# Patient Record
Sex: Female | Born: 1950 | Race: Black or African American | Hispanic: No | Smoking: Former smoker
Health system: Southern US, Community
[De-identification: ages and names within clinical notes are randomized; demographics above are authoritative.]

## PROBLEM LIST (undated history)

## (undated) DIAGNOSIS — I10 Essential (primary) hypertension: Secondary | ICD-10-CM

## (undated) DIAGNOSIS — E7800 Pure hypercholesterolemia, unspecified: Secondary | ICD-10-CM

## (undated) DIAGNOSIS — H579 Unspecified disorder of eye and adnexa: Secondary | ICD-10-CM

## (undated) DIAGNOSIS — F329 Major depressive disorder, single episode, unspecified: Secondary | ICD-10-CM

## (undated) DIAGNOSIS — F419 Anxiety disorder, unspecified: Secondary | ICD-10-CM

## (undated) DIAGNOSIS — F32A Depression, unspecified: Secondary | ICD-10-CM

## (undated) HISTORY — DX: Major depressive disorder, single episode, unspecified: F32.9

## (undated) HISTORY — DX: Unspecified disorder of eye and adnexa: H57.9

## (undated) HISTORY — DX: Pure hypercholesterolemia, unspecified: E78.00

## (undated) HISTORY — DX: Depression, unspecified: F32.A

## (undated) HISTORY — PX: ABDOMINAL HYSTERECTOMY: SHX81

## (undated) HISTORY — DX: Anxiety disorder, unspecified: F41.9

## (undated) HISTORY — PX: APPENDECTOMY: SHX54

---

## 1998-04-17 ENCOUNTER — Emergency Department (HOSPITAL_COMMUNITY): Admission: EM | Admit: 1998-04-17 | Discharge: 1998-04-17 | Payer: Self-pay | Admitting: Emergency Medicine

## 1999-04-20 ENCOUNTER — Other Ambulatory Visit: Admission: RE | Admit: 1999-04-20 | Discharge: 1999-04-20 | Payer: Self-pay | Admitting: Gynecology

## 1999-05-21 ENCOUNTER — Emergency Department (HOSPITAL_COMMUNITY): Admission: EM | Admit: 1999-05-21 | Discharge: 1999-05-21 | Payer: Self-pay | Admitting: Internal Medicine

## 1999-05-28 ENCOUNTER — Encounter: Payer: Self-pay | Admitting: Gynecology

## 1999-05-31 ENCOUNTER — Inpatient Hospital Stay (HOSPITAL_COMMUNITY): Admission: RE | Admit: 1999-05-31 | Discharge: 1999-06-02 | Payer: Self-pay | Admitting: Gynecology

## 1999-05-31 ENCOUNTER — Encounter (INDEPENDENT_AMBULATORY_CARE_PROVIDER_SITE_OTHER): Payer: Self-pay | Admitting: Specialist

## 2000-01-13 ENCOUNTER — Encounter: Payer: Self-pay | Admitting: Emergency Medicine

## 2000-01-13 ENCOUNTER — Emergency Department (HOSPITAL_COMMUNITY): Admission: EM | Admit: 2000-01-13 | Discharge: 2000-01-13 | Payer: Self-pay | Admitting: Emergency Medicine

## 2000-04-07 ENCOUNTER — Ambulatory Visit (HOSPITAL_COMMUNITY): Admission: RE | Admit: 2000-04-07 | Discharge: 2000-04-07 | Payer: Self-pay | Admitting: Gynecology

## 2000-04-07 ENCOUNTER — Encounter: Payer: Self-pay | Admitting: Gynecology

## 2000-04-21 ENCOUNTER — Other Ambulatory Visit: Admission: RE | Admit: 2000-04-21 | Discharge: 2000-04-21 | Payer: Self-pay | Admitting: Gynecology

## 2001-06-03 ENCOUNTER — Other Ambulatory Visit: Admission: RE | Admit: 2001-06-03 | Discharge: 2001-06-03 | Payer: Self-pay | Admitting: Gynecology

## 2001-08-13 ENCOUNTER — Encounter: Payer: Self-pay | Admitting: Gynecology

## 2001-08-13 ENCOUNTER — Ambulatory Visit (HOSPITAL_COMMUNITY): Admission: RE | Admit: 2001-08-13 | Discharge: 2001-08-13 | Payer: Self-pay | Admitting: Gynecology

## 2002-06-29 ENCOUNTER — Other Ambulatory Visit: Admission: RE | Admit: 2002-06-29 | Discharge: 2002-06-29 | Payer: Self-pay | Admitting: Gynecology

## 2002-11-22 ENCOUNTER — Encounter: Payer: Self-pay | Admitting: Gynecology

## 2002-11-22 ENCOUNTER — Ambulatory Visit (HOSPITAL_COMMUNITY): Admission: RE | Admit: 2002-11-22 | Discharge: 2002-11-22 | Payer: Self-pay | Admitting: Gynecology

## 2003-08-29 ENCOUNTER — Emergency Department (HOSPITAL_COMMUNITY): Admission: EM | Admit: 2003-08-29 | Discharge: 2003-08-30 | Payer: Self-pay | Admitting: Emergency Medicine

## 2004-01-19 ENCOUNTER — Ambulatory Visit (HOSPITAL_COMMUNITY): Admission: RE | Admit: 2004-01-19 | Discharge: 2004-01-19 | Payer: Self-pay | Admitting: Gynecology

## 2005-12-31 ENCOUNTER — Ambulatory Visit (HOSPITAL_COMMUNITY): Admission: RE | Admit: 2005-12-31 | Discharge: 2005-12-31 | Payer: Self-pay | Admitting: Gynecology

## 2006-01-01 ENCOUNTER — Other Ambulatory Visit: Admission: RE | Admit: 2006-01-01 | Discharge: 2006-01-01 | Payer: Self-pay | Admitting: Gynecology

## 2006-06-03 ENCOUNTER — Emergency Department (HOSPITAL_COMMUNITY): Admission: EM | Admit: 2006-06-03 | Discharge: 2006-06-03 | Payer: Self-pay | Admitting: Family Medicine

## 2007-01-09 ENCOUNTER — Ambulatory Visit (HOSPITAL_COMMUNITY): Admission: RE | Admit: 2007-01-09 | Discharge: 2007-01-09 | Payer: Self-pay | Admitting: Gynecology

## 2008-03-30 ENCOUNTER — Emergency Department (HOSPITAL_COMMUNITY): Admission: EM | Admit: 2008-03-30 | Discharge: 2008-03-30 | Payer: Self-pay | Admitting: Emergency Medicine

## 2008-07-15 ENCOUNTER — Emergency Department (HOSPITAL_COMMUNITY): Admission: EM | Admit: 2008-07-15 | Discharge: 2008-07-15 | Payer: Self-pay | Admitting: Emergency Medicine

## 2008-08-11 ENCOUNTER — Ambulatory Visit (HOSPITAL_COMMUNITY): Admission: RE | Admit: 2008-08-11 | Discharge: 2008-08-11 | Payer: Self-pay | Admitting: Gynecology

## 2008-08-18 ENCOUNTER — Emergency Department (HOSPITAL_COMMUNITY): Admission: EM | Admit: 2008-08-18 | Discharge: 2008-08-19 | Payer: Self-pay | Admitting: Emergency Medicine

## 2008-08-23 ENCOUNTER — Encounter (INDEPENDENT_AMBULATORY_CARE_PROVIDER_SITE_OTHER): Payer: Self-pay | Admitting: *Deleted

## 2008-08-23 DIAGNOSIS — F418 Other specified anxiety disorders: Secondary | ICD-10-CM

## 2008-08-23 DIAGNOSIS — K219 Gastro-esophageal reflux disease without esophagitis: Secondary | ICD-10-CM | POA: Insufficient documentation

## 2008-08-26 ENCOUNTER — Encounter: Payer: Self-pay | Admitting: Family Medicine

## 2008-08-26 ENCOUNTER — Ambulatory Visit: Payer: Self-pay | Admitting: Family Medicine

## 2008-08-26 DIAGNOSIS — R5381 Other malaise: Secondary | ICD-10-CM

## 2008-08-26 DIAGNOSIS — E663 Overweight: Secondary | ICD-10-CM | POA: Insufficient documentation

## 2008-08-26 DIAGNOSIS — R209 Unspecified disturbances of skin sensation: Secondary | ICD-10-CM | POA: Insufficient documentation

## 2008-08-26 DIAGNOSIS — R5383 Other fatigue: Secondary | ICD-10-CM

## 2008-08-26 DIAGNOSIS — I10 Essential (primary) hypertension: Secondary | ICD-10-CM

## 2008-08-26 DIAGNOSIS — R011 Cardiac murmur, unspecified: Secondary | ICD-10-CM

## 2008-08-29 LAB — CONVERTED CEMR LAB
ALT: 10 units/L (ref 0–35)
AST: 19 units/L (ref 0–37)
Albumin: 4.4 g/dL (ref 3.5–5.2)
Alkaline Phosphatase: 37 units/L — ABNORMAL LOW (ref 39–117)
HDL: 71 mg/dL (ref >39)
MCHC: 31.6 g/dL (ref 30.0–36.0)
MCV: 86.8 fL (ref 78.0–100.0)
Platelets: 336 10*3/uL (ref 150–400)
Potassium: 4.7 meq/L (ref 3.5–5.3)
RDW: 13.8 % (ref 11.5–15.5)
Sodium: 142 meq/L (ref 135–145)
TSH: 1.371 microintl units/mL (ref 0.350–4.500)
Total Bilirubin: 0.8 mg/dL (ref 0.3–1.2)
Total CHOL/HDL Ratio: 3.8
Total Protein: 7.7 g/dL (ref 6.0–8.3)

## 2008-09-06 ENCOUNTER — Encounter: Payer: Self-pay | Admitting: Family Medicine

## 2008-09-16 DIAGNOSIS — K6389 Other specified diseases of intestine: Secondary | ICD-10-CM

## 2008-09-16 DIAGNOSIS — K5730 Diverticulosis of large intestine without perforation or abscess without bleeding: Secondary | ICD-10-CM | POA: Insufficient documentation

## 2008-10-03 ENCOUNTER — Encounter: Payer: Self-pay | Admitting: Family Medicine

## 2010-02-19 ENCOUNTER — Ambulatory Visit (HOSPITAL_COMMUNITY)
Admission: RE | Admit: 2010-02-19 | Discharge: 2010-02-19 | Payer: Self-pay | Source: Home / Self Care | Attending: Family Medicine | Admitting: Family Medicine

## 2010-06-18 LAB — URINALYSIS, ROUTINE W REFLEX MICROSCOPIC
Ketones, ur: NEGATIVE mg/dL
Protein, ur: NEGATIVE mg/dL
Specific Gravity, Urine: 1.008 (ref 1.005–1.030)
pH: 5.5 (ref 5.0–8.0)

## 2010-06-18 LAB — COMPREHENSIVE METABOLIC PANEL
Alkaline Phosphatase: 43 U/L (ref 39–117)
BUN: 5 mg/dL — ABNORMAL LOW (ref 6–23)
GFR calc non Af Amer: >60 mL/min (ref >60)
Glucose, Bld: 99 mg/dL (ref 70–99)
Total Protein: 7.2 g/dL (ref 6.0–8.3)

## 2010-06-18 LAB — DIFFERENTIAL
Lymphocytes Relative: 41 % (ref 12–46)
Monocytes Absolute: 0.5 10*3/uL (ref 0.1–1.0)
Neutro Abs: 4.2 10*3/uL (ref 1.7–7.7)

## 2010-06-18 LAB — URINE MICROSCOPIC-ADD ON

## 2010-06-18 LAB — CBC
HCT: 37.8 % (ref 36.0–46.0)
Platelets: 344 10*3/uL (ref 150–400)
RBC: 4.35 MIL/uL (ref 3.87–5.11)
WBC: 8.1 10*3/uL (ref 4.0–10.5)

## 2011-01-08 ENCOUNTER — Emergency Department (HOSPITAL_COMMUNITY)
Admission: EM | Admit: 2011-01-08 | Discharge: 2011-01-09 | Disposition: A | Discharge status: Home / Self Care | Payer: Self-pay | Attending: Emergency Medicine | Admitting: Emergency Medicine

## 2011-01-08 DIAGNOSIS — R300 Dysuria: Secondary | ICD-10-CM | POA: Insufficient documentation

## 2011-01-08 DIAGNOSIS — N3090 Cystitis, unspecified without hematuria: Secondary | ICD-10-CM | POA: Insufficient documentation

## 2011-01-08 DIAGNOSIS — R310 Gross hematuria: Secondary | ICD-10-CM | POA: Insufficient documentation

## 2011-01-08 DIAGNOSIS — R109 Unspecified abdominal pain: Secondary | ICD-10-CM | POA: Insufficient documentation

## 2011-01-09 ENCOUNTER — Encounter (HOSPITAL_COMMUNITY): Payer: Self-pay

## 2011-01-09 ENCOUNTER — Emergency Department (HOSPITAL_COMMUNITY): Payer: Self-pay

## 2011-01-09 LAB — URINALYSIS, ROUTINE W REFLEX MICROSCOPIC
Bilirubin Urine: NEGATIVE
Nitrite: NEGATIVE
Protein, ur: 100 mg/dL — AB
Urobilinogen, UA: 1 mg/dL (ref 0.0–1.0)

## 2011-01-09 LAB — URINE MICROSCOPIC-ADD ON

## 2011-01-09 LAB — POCT I-STAT, CHEM 8
BUN: 21 mg/dL (ref 6–23)
Creatinine, Ser: 1 mg/dL (ref 0.50–1.10)
TCO2: 24 mmol/L (ref 0–100)

## 2011-01-09 LAB — DIFFERENTIAL
Basophils Relative: 0 % (ref 0–1)
Lymphocytes Relative: 25 % (ref 12–46)
Monocytes Absolute: 0.7 10*3/uL (ref 0.1–1.0)
Monocytes Relative: 6 % (ref 3–12)
Neutro Abs: 7.5 10*3/uL (ref 1.7–7.7)

## 2011-01-09 LAB — CBC
HCT: 40.7 % (ref 36.0–46.0)
Hemoglobin: 13.9 g/dL (ref 12.0–15.0)
MCH: 29.3 pg (ref 26.0–34.0)
MCV: 85.9 fL (ref 78.0–100.0)
Platelets: 360 10*3/uL (ref 150–400)
RDW: 13.2 % (ref 11.5–15.5)

## 2011-01-09 MED ORDER — IOHEXOL 300 MG/ML  SOLN
100.0000 mL | Freq: Once | INTRAMUSCULAR | Status: AC | PRN
  Administered 2011-01-09: 100 mL via INTRAVENOUS

## 2011-05-28 ENCOUNTER — Encounter (HOSPITAL_COMMUNITY): Payer: Self-pay | Admitting: Emergency Medicine

## 2011-05-28 ENCOUNTER — Emergency Department (INDEPENDENT_AMBULATORY_CARE_PROVIDER_SITE_OTHER)
Admission: EM | Admit: 2011-05-28 | Discharge: 2011-05-28 | Disposition: A | Discharge status: Home / Self Care | Payer: Self-pay | Source: Home / Self Care | Attending: Emergency Medicine | Admitting: Emergency Medicine

## 2011-05-28 DIAGNOSIS — N3000 Acute cystitis without hematuria: Secondary | ICD-10-CM

## 2011-05-28 HISTORY — DX: Essential (primary) hypertension: I10

## 2011-05-28 LAB — POCT URINALYSIS DIP (DEVICE)
Bilirubin Urine: NEGATIVE
Leukocytes, UA: NEGATIVE
Protein, ur: 30 mg/dL — AB

## 2011-05-28 LAB — WET PREP, GENITAL: Trich, Wet Prep: NONE SEEN

## 2011-05-28 MED ORDER — HYDROCHLOROTHIAZIDE 12.5 MG PO TABS: 12.5000 mg | ORAL_TABLET | Freq: Every day | ORAL | Status: DC

## 2011-05-28 MED ORDER — CEPHALEXIN 500 MG PO CAPS: 500.0000 mg | ORAL_CAPSULE | Freq: Three times a day (TID) | ORAL | Status: AC

## 2011-05-28 MED ORDER — PHENAZOPYRIDINE HCL 200 MG PO TABS: 200.0000 mg | ORAL_TABLET | Freq: Three times a day (TID) | ORAL | Status: AC | PRN

## 2011-05-28 NOTE — ED Notes (Addendum)
PT HERE WITH SX BLADDER PRESSURE AND PAIN WITH URINATION,BURNING AND DYSURIA THAT FLARED UP Sunday.PT WAS SEEN IN ER FOR SAME SX X AGO AND TREATED WITH ORAL ATB'S.PT IN PROCESS OF GETTING ORANGE CARD  BUT STATES THE PAIN IS GETTING WORSE.DENIES VAG D/C OR HEMATURIA.TEMP 99.1

## 2011-05-28 NOTE — ED Provider Notes (Signed)
Chief Complaint  Patient presents with  . Urinary Tract Infection    History of Present Illness:   Tammy Santiago is a 61 year old female who has had suprapubic pain, pain with urination, burning with urination, difficulty urinating, and urinating frequent, small amounts. She's not had any blood in her urine. She felt chilled and had any fever or sweats. No nausea, vomiting, or anorexia. No constipation or diarrhea. No GYN complaints. She had a urinary tract infection before, about 6 months ago. She was at was [facility]. Because of severe pain she underwent a pelvic CT scan which only showed thickened bladder walls. She was given antibiotics and her symptoms improved. She is status post hysterectomy.  Review of Systems:  Other than noted above, the patient denies any of the following symptoms: Constitutional:  No fever, chills, fatigue, weight loss or anorexia. Lungs:  No cough or shortness of breath. Heart:  No chest pain, palpitations, syncope or edema. Abdomen:  No nausea, vomiting, hematememesis, melena, diarrhea, or hematochezia. GU:  No dysuria, frequency, urgency, or hematuria. Gyn:  No vaginal discharge, itching, abnormal bleeding or pelvic pain. Skin:  No rash or itching.  PMFSH:  Past medical history, family history, social history, meds, and allergies were reviewed.  Physical Exam:   Vital signs:  BP 183/94  Pulse 65  Temp(Src) 99.1 F (37.3 C) (Oral)  Resp 17  SpO2 97% Gen:  Alert, oriented, in no distress. Lungs:  Breath sounds clear and equal bilaterally.  No wheezes, rales or rhonchi. Heart:  Regular rhythm.  No gallops or murmers.   Abdomen:  Abdomen is flat, soft, nondistended. She has moderate suprapubic and bilateral lower quadrant pain to palpation without guarding or rebound. No organomegaly or mass. Bowel sounds are normally active. Pelvic:  Normal external genitalia. Vaginal mucosa was normal. Cervix and uterus were surgically absent. She has moderate pain  on pelvic exam at the midline and in both lower quadrants. No masses. Skin:  Clear, warm and dry.  No rash.  Labs:   Results for orders placed during the hospital encounter of 05/28/11  POCT URINALYSIS DIP (DEVICE)      Component Value Range   Glucose, UA NEGATIVE  NEGATIVE (mg/dL)   Bilirubin Urine NEGATIVE  NEGATIVE    Ketones, ur NEGATIVE  NEGATIVE (mg/dL)   Specific Gravity, Urine 1.010  1.005 - 1.030    Hgb urine dipstick TRACE (*) NEGATIVE    pH >=9.0  5.0 - 8.0    Protein, ur 30 (*) NEGATIVE (mg/dL)   Urobilinogen, UA 0.2  0.0 - 1.0 (mg/dL)   Nitrite NEGATIVE  NEGATIVE    Leukocytes, UA NEGATIVE  NEGATIVE   WET PREP, GENITAL      Component Value Range   Yeast Wet Prep HPF POC NONE SEEN  NONE SEEN    Trich, Wet Prep NONE SEEN  NONE SEEN    Clue Cells Wet Prep HPF POC NONE SEEN  NONE SEEN    WBC, Wet Prep HPF POC TOO NUMEROUS TO COUNT (*) NONE SEEN     Other Labs Obtained at Urgent Care Center:  A urine culture was obtained.  Results are pending at this time and we will call about any positive results.  Assessment:   Diagnoses that have been ruled out:  None  Diagnoses that are still under consideration:  None  Final diagnoses:  Acute cystitis    Plan:   1.  The following meds were prescribed:   New Prescriptions  CEPHALEXIN (KEFLEX) 500 MG CAPSULE    Take 1 capsule (500 mg total) by mouth 3 (three) times daily.   HYDROCHLOROTHIAZIDE (HYDRODIURIL) 12.5 MG TABLET    Take 1 tablet (12.5 mg total) by mouth daily.   PHENAZOPYRIDINE (PYRIDIUM) 200 MG TABLET    Take 1 tablet (200 mg total) by mouth 3 (three) times daily as needed for pain.   2.  The patient was instructed in symptomatic care and handouts were given. 3.  The patient was told to return if becoming worse in any way, if no better in 3 or 4 days, and given some red flag symptoms that would indicate earlier return. 4.  The patient was told to followup with her primary care doctor because of elevated blood  pressure.    Reuben Likes, MD 05/28/11 [phone]

## 2011-05-28 NOTE — Discharge Instructions (Signed)

## 2011-05-29 LAB — URINE CULTURE
Colony Count: 50000
Culture  Setup Time: 201303191518

## 2011-06-18 ENCOUNTER — Encounter: Payer: Self-pay | Admitting: Family Medicine

## 2011-06-18 ENCOUNTER — Ambulatory Visit (INDEPENDENT_AMBULATORY_CARE_PROVIDER_SITE_OTHER): Payer: Self-pay | Admitting: Family Medicine

## 2011-06-18 ENCOUNTER — Telehealth: Payer: Self-pay | Admitting: Family Medicine

## 2011-06-18 VITALS — BP 157/79 | HR 66 | Temp 98.5°F | Ht 59.5 in | Wt 144.0 lb

## 2011-06-18 DIAGNOSIS — I10 Essential (primary) hypertension: Secondary | ICD-10-CM

## 2011-06-18 DIAGNOSIS — F329 Major depressive disorder, single episode, unspecified: Secondary | ICD-10-CM

## 2011-06-18 DIAGNOSIS — E559 Vitamin D deficiency, unspecified: Secondary | ICD-10-CM | POA: Insufficient documentation

## 2011-06-18 DIAGNOSIS — K219 Gastro-esophageal reflux disease without esophagitis: Secondary | ICD-10-CM

## 2011-06-18 DIAGNOSIS — R5383 Other fatigue: Secondary | ICD-10-CM

## 2011-06-18 DIAGNOSIS — E663 Overweight: Secondary | ICD-10-CM

## 2011-06-18 DIAGNOSIS — R5381 Other malaise: Secondary | ICD-10-CM

## 2011-06-18 DIAGNOSIS — F3289 Other specified depressive episodes: Secondary | ICD-10-CM

## 2011-06-18 HISTORY — DX: Gastro-esophageal reflux disease without esophagitis: K21.9

## 2011-06-18 LAB — LIPID PANEL
Cholesterol: 218 mg/dL — ABNORMAL HIGH (ref 0–200)
HDL: 74 mg/dL (ref >39)
Triglycerides: 74 mg/dL (ref <150)
VLDL: 15 mg/dL (ref 0–40)

## 2011-06-18 LAB — COMPREHENSIVE METABOLIC PANEL
AST: 21 U/L (ref 0–37)
BUN: 19 mg/dL (ref 6–23)
Calcium: 9.4 mg/dL (ref 8.4–10.5)
Chloride: 108 mEq/L (ref 96–112)
Creat: 0.67 mg/dL (ref 0.50–1.10)

## 2011-06-18 LAB — TSH: TSH: 1.484 u[IU]/mL (ref 0.350–4.500)

## 2011-06-18 MED ORDER — FAMOTIDINE 20 MG PO TABS: 20.0000 mg | ORAL_TABLET | Freq: Two times a day (BID) | ORAL | Status: DC

## 2011-06-18 MED ORDER — CITALOPRAM HYDROBROMIDE 20 MG PO TABS: 20.0000 mg | ORAL_TABLET | Freq: Every day | ORAL | Status: DC

## 2011-06-18 MED ORDER — HYDROCHLOROTHIAZIDE 12.5 MG PO TABS: 12.5000 mg | ORAL_TABLET | Freq: Every day | ORAL | Status: DC

## 2011-06-18 MED ORDER — HYDROXYZINE HCL 10 MG PO TABS
10.0000 mg | ORAL_TABLET | Freq: Three times a day (TID) | ORAL | Status: DC | PRN
Start: 2011-06-18 — End: 2011-06-19

## 2011-06-18 NOTE — Telephone Encounter (Signed)
Pt states that the anxiety med is too expensive - wants to know if there is something cheaper.  [facility] [place]

## 2011-06-18 NOTE — Patient Instructions (Signed)
Very nice to meet you. I am giving you some new medications. I'm giving you your blood pressure medicine called hydrochlorothiazide. Take one pill daily I'm giving you a medicine for her depression. I when she to take one pill daily. Please be patient this medication will take 3-4 weeks to really start working. I am also giving you a medicine for your anxiety. You can take this medicine up to 3 times a day as needed. I refilled your reflex medicine to try to help with that heartburn. I'm going to get some labs today and I will call you when I get the results. Please come back at her followup appointment so I can see how these medications are helping.

## 2011-06-18 NOTE — Telephone Encounter (Signed)
Patient called back and said that her son is going to send her the money to get the medication.

## 2011-06-19 ENCOUNTER — Telehealth: Payer: Self-pay | Admitting: Family Medicine

## 2011-06-19 MED ORDER — HYDROXYZINE HCL 10 MG PO TABS: 10.0000 mg | ORAL_TABLET | Freq: Three times a day (TID) | ORAL | Status: DC | PRN

## 2011-06-19 NOTE — Assessment & Plan Note (Signed)
Refilled previcid, no formal consult by GI in the past, no red flags will continue to monitor. Trial of PPI

## 2011-06-19 NOTE — Telephone Encounter (Signed)
Called pt and discussed the hydroxizine will send to [facility] due to it being on the four dollar plan. Told her labs are doing fine we'll continue current regimen.

## 2011-06-19 NOTE — Assessment & Plan Note (Addendum)
PHQ 9 score of 22 on April 9 Started on Celexa on April 9. Declined therapy. Discussed safety plan and if side effects of medication occurs.  F/u in 2 weeks.

## 2011-06-19 NOTE — Assessment & Plan Note (Addendum)
Pt is not at goal and not taking medication. Restart hydrochlorothiazide recheck blood pressure in 2 weeks. Baseline be met at this time as well.

## 2011-06-19 NOTE — Telephone Encounter (Signed)
Called patient back told that the medication is on the four dollar plan of care.

## 2011-06-19 NOTE — Progress Notes (Signed)
  Subjective:    Patient ID: Tammy Santiago, female    DOB: 1950/06/25, 61 y.o.   MRN: [id number]  HPI 61 year old female here to establish care as a new patient.  1. Hypertension Blood pressure at home: Not checking Blood pressure today: 157/79 Taking Meds: No supposed to be on hydrochlorothiazide continue to get it from urgent care or emergency room. Side effects: No ROS: Denies headache visual changes nausea, vomiting, chest pain or abdominal pain or shortness of breath.  #2 GERD- patient has a history of GERD she states clots heartburn after certain meals. Patient had take Pepcid in the past with good results. Patient denies any weight loss any nausea vomiting any change in bowel habits. Patient has not been taking the medication she is currently out.  #3 anxiety-patient states most of the time she feels so anxious that she does not even want to leave the house. Patient has had this feeling for multiple years. Patient has not ever seen a professional for this problem. Patient states that this problem has gotten much worse recently and she has times where she feels like she has to cry all the time. Patient also states she gets poor sleep has lots of insomnia does not do activities that she enjoys but denies suicidal or homicidal ideation. Patient denies any hallucinations visual or auditory but states from time to time certain things do not seem real. PHQ 9 score of 22  Past Medical History  Diagnosis Date  . Hypertension   . Anxiety   . Depression    History reviewed. No pertinent past surgical history.  History  Substance Use Topics  . Smoking status: Never Smoker   . Smokeless tobacco: Not on file  . Alcohol Use: No    Family History  Problem Relation Age of Onset  . Alcohol abuse Mother   . Heart disease Mother   . Depression Mother   . Diabetes Mother   . Hyperlipidemia Mother   . Hypertension Mother     Review of Systems Denies fever, chills, nausea vomiting  abdominal pain, dysuria, chest pain, shortness of breath dyspnea on exertion or numbness in extremities     Objective:   Physical Exam  Vitals reviewed. Constitutional: She is oriented to person, place, and time. She appears well-developed.  HENT:  Right Ear: External ear normal.  Left Ear: External ear normal.  Mouth/Throat: Oropharynx is clear and moist.  Eyes: Conjunctivae are normal. Pupils are equal, round, and reactive to light.  Neck: Normal range of motion. No thyromegaly present.  Cardiovascular: Normal rate and regular rhythm.   Murmur heard.      1/6 SEM LLSB  Abdominal: Soft. Bowel sounds are normal.  Musculoskeletal: Normal range of motion.  Neurological: She is alert and oriented to person, place, and time. She has normal reflexes.  Skin: Skin is warm and dry.  Psychiatric:       Patient does seem anxious with pressured speech able to stand topic able to follow directions well. Does seem to have good judgment.      Assessment & Plan:

## 2011-07-08 ENCOUNTER — Encounter: Payer: Self-pay | Admitting: Family Medicine

## 2011-07-08 ENCOUNTER — Ambulatory Visit (INDEPENDENT_AMBULATORY_CARE_PROVIDER_SITE_OTHER): Payer: Self-pay | Admitting: Family Medicine

## 2011-07-08 VITALS — BP 124/77 | HR 61 | Temp 98.3°F | Ht 59.5 in | Wt 144.2 lb

## 2011-07-08 DIAGNOSIS — I10 Essential (primary) hypertension: Secondary | ICD-10-CM

## 2011-07-08 DIAGNOSIS — F3289 Other specified depressive episodes: Secondary | ICD-10-CM

## 2011-07-08 DIAGNOSIS — K219 Gastro-esophageal reflux disease without esophagitis: Secondary | ICD-10-CM

## 2011-07-08 DIAGNOSIS — F329 Major depressive disorder, single episode, unspecified: Secondary | ICD-10-CM

## 2011-07-08 MED ORDER — HYDROXYZINE HCL 10 MG PO TABS: 10.0000 mg | ORAL_TABLET | Freq: Three times a day (TID) | ORAL | Status: AC | PRN

## 2011-07-08 MED ORDER — CITALOPRAM HYDROBROMIDE 20 MG PO TABS: 20.0000 mg | ORAL_TABLET | Freq: Every day | ORAL | Status: DC

## 2011-07-08 NOTE — Progress Notes (Signed)
  Subjective:    Patient ID: Tammy Santiago, female    DOB: [birth year], 61 y.o.   MRN: [id number]  HPI  61 year old female here to establish care as a new patient.  1.  F/u Hypertension Blood pressure at home: Not checking Blood pressure today: 124/77 Taking Meds:yes Side effects: No ROS: Denies headache visual changes nausea, vomiting, chest pain or abdominal pain or shortness of breath.  #2  F/u GERD- patient is to get amantadine and is feeling much better. Patient denies any heartburn any diarrhea constipation any weight loss out of the ordinary.  #57F/u  anxiety-patient was placed on Celexa and hydroxyzine for breakthrough anxiety approximately 3 weeks ago. PHQ 9 score of 22 on April 10 now score is 5  Past Medical History  Diagnosis Date  . Hypertension   . Anxiety   . Depression    No past surgical history on file.  History  Substance Use Topics  . Smoking status: Never Smoker   . Smokeless tobacco: Not on file  . Alcohol Use: No    Family History  Problem Relation Age of Onset  . Alcohol abuse Mother   . Heart disease Mother   . Depression Mother   . Diabetes Mother   . Hyperlipidemia Mother   . Hypertension Mother     Review of Systems  Denies fever, chills, nausea vomiting abdominal pain, dysuria, chest pain, shortness of breath dyspnea on exertion or numbness in extremities     Objective:   Physical Exam  Vitals reviewed. Constitutional: She is oriented to person, place, and time. She appears well-developed.  HENT:  Right Ear: External ear normal.  Left Ear: External ear normal.  Mouth/Throat: Oropharynx is clear and moist.  Eyes: Conjunctivae are normal. Pupils are equal, round, and reactive to light.  Neck: Normal range of motion. No thyromegaly present.  Cardiovascular: Normal rate and regular rhythm.   Murmur heard.      1/6 SEM LLSB  Abdominal: Soft. Bowel sounds are normal.  Musculoskeletal: Normal range of motion.  Neurological: She is  alert and oriented to person, place, and time. She has normal reflexes.  Skin: Skin is warm and dry.  Psychiatric:       Patient is in good spirits much friendlier not anxious at all.      Assessment & Plan:

## 2011-07-08 NOTE — Patient Instructions (Signed)
It is fantastic to see you. Your blood pressure is perfect! We will continue the same medications. I am giving you a prescription for the hydroxyzine to have on hand when you run out. I would see if you can find another coupon when you need it. One she'll have Jaynee Eagles please give [place] a call and we will try to get her colonoscopy and mammogram scheduled. You can call us at [phone] Once again I will be leaving at the end of June but if you need anything before then please don't hesitate to call. I think you new physician will be Dr. Annice Pih.

## 2011-07-08 NOTE — Assessment & Plan Note (Signed)
Well-controlled we'll make no changes.

## 2011-07-08 NOTE — Assessment & Plan Note (Signed)
At goal we'll make no changes in management.

## 2011-07-08 NOTE — Assessment & Plan Note (Signed)
Continue 20 mg of Celexa.

## 2011-07-17 ENCOUNTER — Other Ambulatory Visit: Payer: Self-pay | Admitting: Family Medicine

## 2011-07-17 DIAGNOSIS — Z1231 Encounter for screening mammogram for malignant neoplasm of breast: Secondary | ICD-10-CM

## 2011-08-12 ENCOUNTER — Ambulatory Visit (HOSPITAL_COMMUNITY)
Admission: RE | Admit: 2011-08-12 | Discharge: 2011-08-12 | Disposition: A | Discharge status: Home / Self Care | Payer: Self-pay | Source: Ambulatory Visit | Attending: *Deleted | Admitting: *Deleted

## 2011-08-12 DIAGNOSIS — Z1231 Encounter for screening mammogram for malignant neoplasm of breast: Secondary | ICD-10-CM | POA: Insufficient documentation

## 2011-08-19 ENCOUNTER — Other Ambulatory Visit: Payer: Self-pay | Admitting: Family Medicine

## 2011-08-19 DIAGNOSIS — R928 Other abnormal and inconclusive findings on diagnostic imaging of breast: Secondary | ICD-10-CM

## 2011-08-27 ENCOUNTER — Ambulatory Visit
Admission: RE | Admit: 2011-08-27 | Discharge: 2011-08-27 | Disposition: A | Discharge status: Home / Self Care | Payer: Self-pay | Source: Ambulatory Visit | Attending: Family Medicine | Admitting: Family Medicine

## 2011-08-27 DIAGNOSIS — R928 Other abnormal and inconclusive findings on diagnostic imaging of breast: Secondary | ICD-10-CM

## 2011-09-26 ENCOUNTER — Emergency Department (INDEPENDENT_AMBULATORY_CARE_PROVIDER_SITE_OTHER): Payer: Self-pay

## 2011-09-26 ENCOUNTER — Emergency Department (INDEPENDENT_AMBULATORY_CARE_PROVIDER_SITE_OTHER)
Admission: EM | Admit: 2011-09-26 | Discharge: 2011-09-26 | Disposition: A | Discharge status: Home / Self Care | Payer: Self-pay | Source: Home / Self Care | Attending: Family Medicine | Admitting: Family Medicine

## 2011-09-26 ENCOUNTER — Encounter (HOSPITAL_COMMUNITY): Payer: Self-pay | Admitting: Nurse Practitioner

## 2011-09-26 DIAGNOSIS — M25559 Pain in unspecified hip: Secondary | ICD-10-CM

## 2011-09-26 DIAGNOSIS — M25552 Pain in left hip: Secondary | ICD-10-CM

## 2011-09-26 MED ORDER — CYCLOBENZAPRINE HCL 10 MG PO TABS: 10.0000 mg | ORAL_TABLET | Freq: Two times a day (BID) | ORAL | Status: DC | PRN

## 2011-09-26 MED ORDER — HYDROCODONE-ACETAMINOPHEN 5-325 MG PO TABS
ORAL_TABLET | ORAL | Status: AC
  Filled 2011-09-26: qty 1

## 2011-09-26 MED ORDER — TRAMADOL HCL 50 MG PO TABS: 50.0000 mg | ORAL_TABLET | Freq: Four times a day (QID) | ORAL | Status: AC | PRN

## 2011-09-26 MED ORDER — HYDROCODONE-ACETAMINOPHEN 5-325 MG PO TABS
1.0000 | ORAL_TABLET | Freq: Once | ORAL | Status: AC
  Administered 2011-09-26: 1 via ORAL

## 2011-09-26 MED ORDER — IBUPROFEN 800 MG PO TABS
800.0000 mg | ORAL_TABLET | Freq: Once | ORAL | Status: AC
  Administered 2011-09-26: 800 mg via ORAL

## 2011-09-26 MED ORDER — IBUPROFEN 600 MG PO TABS: 600.0000 mg | ORAL_TABLET | Freq: Three times a day (TID) | ORAL | Status: DC | PRN

## 2011-09-26 NOTE — ED Notes (Signed)
Pt complains of stabbing left hip pain that occasionally shoots down leg. Pain started last night when she bent over to unlock house door.

## 2011-09-28 NOTE — ED Provider Notes (Signed)
History     CSN: [id number]  Arrival date & time 09/26/11  1157   First MD Initiated Contact with Patient 09/26/11 1242      Chief Complaint  Patient presents with  . Hip Pain    (Consider location/radiation/quality/duration/timing/severity/associated sxs/prior treatment) HPI Comments: 61 year old female with a history of anxiety and depression. Here complaining of left hip pain since last night. He describes pain as a stabbing in character with occasional radiation to the right lateral thigh. Pain worse with bending forward. Patient states that she was pulling and pushing furniture in her bedroom before the beginning of her symptoms. Denies dysuria or hematuria. No abdomen no pain fever or chills. No lowest remedy weakness, numbness or paresthesias. No urinary or stool incontinence. Has not taken any medications for her symptoms today as she is worried about interactions with her chronic medications.    Past Medical History  Diagnosis Date  . Hypertension   . Anxiety   . Depression     No past surgical history on file.  Family History  Problem Relation Age of Onset  . Alcohol abuse Mother   . Heart disease Mother   . Depression Mother   . Diabetes Mother   . Hyperlipidemia Mother   . Hypertension Mother     History  Substance Use Topics  . Smoking status: Current Some Day Smoker  . Smokeless tobacco: Not on file  . Alcohol Use: Yes     occasional    OB History    Grav Para Term Preterm Abortions TAB SAB Ect Mult Living                  Review of Systems  Constitutional: Negative for fever, chills, diaphoresis, appetite change and fatigue.  HENT: Negative for sore throat.   Gastrointestinal: Negative for nausea, vomiting, abdominal pain and diarrhea.  Genitourinary: Negative for dysuria, frequency, hematuria and flank pain.  Musculoskeletal:       Hip pain as per HPI  Skin: Negative for rash.  Psychiatric/Behavioral: The patient is nervous/anxious.   All  other systems reviewed and are negative.    Allergies  Review of patient's allergies indicates no known allergies.  Home Medications   Current Outpatient Rx  Name Route Sig Dispense Refill  . CITALOPRAM HYDROBROMIDE 20 MG PO TABS Oral Take 1 tablet (20 mg total) by mouth daily. 90 tablet 3  . CYCLOBENZAPRINE HCL 10 MG PO TABS Oral Take 1 tablet (10 mg total) by mouth 2 (two) times daily as needed for muscle spasms. 20 tablet 0  . FAMOTIDINE 20 MG PO TABS Oral Take 1 tablet (20 mg total) by mouth 2 (two) times daily. 60 tablet 1  . HYDROCHLOROTHIAZIDE 12.5 MG PO TABS Oral Take 1 tablet (12.5 mg total) by mouth daily. 30 tablet 0  . IBUPROFEN 600 MG PO TABS Oral Take 1 tablet (600 mg total) by mouth every 8 (eight) hours as needed for pain. 20 tablet 0  . SIMVASTATIN 40 MG PO TABS  Take 1/2 tablet at bedtime for 1 week, then 1 tablet by mouth at bedtime for cholesterol     . TRAMADOL HCL 50 MG PO TABS Oral Take 1 tablet (50 mg total) by mouth every 6 (six) hours as needed for pain. 15 tablet 0    BP 157/93  Pulse 82  Temp 97.7 F (36.5 C) (Oral)  Resp 20  SpO2 98%  Physical Exam  Nursing note and vitals reviewed. Constitutional: She is oriented to  person, place, and time. She appears well-developed and well-nourished.       tearfull at times impress anxious  HENT:  Head: Normocephalic and atraumatic.  Mouth/Throat: Oropharynx is clear and moist.  Eyes: EOM are normal. Pupils are equal, round, and reactive to light.  Neck: Neck supple.  Cardiovascular: Normal rate, regular rhythm and normal heart sounds.   Pulmonary/Chest: Effort normal and breath sounds normal.  Abdominal: Soft. Bowel sounds are normal. She exhibits no distension and no mass. There is no tenderness.       No CVT  Musculoskeletal:       Left Hip: Patient is weight bearing on left side no limping noted. Was able to get to examen table by self with no assistance.  Tenderness reported with palpation over left SI  joint. Also increased tone and tenderness with palpation diffusely over right gluteal area and over hip joint. No erythema or swelling associated. Left hip with fair external rotation and reported pain with internal rotation.   Left leg appears neurovascularly intact.  Lymphadenopathy:    She has no cervical adenopathy.  Neurological: She is alert and oriented to person, place, and time.  Skin: No rash noted. She is not diaphoretic.    ED Course  Procedures (including critical care time)  Labs Reviewed - No data to display No results found.   1. Hip pain, left       MDM  Osteoarthritic changes otherwise no acute changes in x-rays. Impress osteoarthritis, associated with muscle spasms. Prescribed Flexeril, ibuprofen and tramadol. Asked to followup with her primary care provider if persistent symptoms despite completing treatment.        Sharin Grave, MD 09/29/11 [phone]

## 2011-10-04 ENCOUNTER — Ambulatory Visit (INDEPENDENT_AMBULATORY_CARE_PROVIDER_SITE_OTHER): Payer: Self-pay | Admitting: Family Medicine

## 2011-10-04 ENCOUNTER — Encounter: Payer: Self-pay | Admitting: Family Medicine

## 2011-10-04 VITALS — BP 165/98 | HR 74 | Temp 98.2°F | Ht <= 58 in | Wt 136.5 lb

## 2011-10-04 DIAGNOSIS — M25559 Pain in unspecified hip: Secondary | ICD-10-CM

## 2011-10-04 DIAGNOSIS — M79606 Pain in leg, unspecified: Secondary | ICD-10-CM

## 2011-10-04 DIAGNOSIS — R300 Dysuria: Secondary | ICD-10-CM

## 2011-10-04 DIAGNOSIS — M79609 Pain in unspecified limb: Secondary | ICD-10-CM

## 2011-10-04 LAB — POCT URINALYSIS DIPSTICK
Ketones, UA: NEGATIVE
Protein, UA: NEGATIVE
Urobilinogen, UA: 1

## 2011-10-04 LAB — POCT UA - MICROSCOPIC ONLY

## 2011-10-04 MED ORDER — TRAMADOL HCL 50 MG PO TABS
50.0000 mg | ORAL_TABLET | Freq: Three times a day (TID) | ORAL | Status: AC | PRN
Start: 2011-10-04 — End: 2011-10-14

## 2011-10-04 MED ORDER — KETOROLAC TROMETHAMINE 30 MG/ML IJ SOLN
30.0000 mg | Freq: Once | INTRAMUSCULAR | Status: AC
  Administered 2011-10-04: 30 mg via INTRAMUSCULAR

## 2011-10-04 MED ORDER — NAPROXEN 500 MG PO TABS: 500.0000 mg | ORAL_TABLET | Freq: Two times a day (BID) | ORAL | Status: AC

## 2011-10-04 MED ORDER — CYCLOBENZAPRINE HCL 10 MG PO TABS: 10.0000 mg | ORAL_TABLET | Freq: Two times a day (BID) | ORAL | Status: AC | PRN

## 2011-10-04 NOTE — Patient Instructions (Addendum)
I am sorry you are hurting so badly.  Please take the naproxen once in the morning and once at night every day for a week.  You can take the tramadol about 3 times a day as needed for pain, and the flexeril twice a day as needed for pain.  Please come back and see is in one to two weeks if you are not feeling better.

## 2011-10-05 DIAGNOSIS — G8929 Other chronic pain: Secondary | ICD-10-CM | POA: Insufficient documentation

## 2011-10-05 DIAGNOSIS — R300 Dysuria: Secondary | ICD-10-CM | POA: Insufficient documentation

## 2011-10-05 DIAGNOSIS — M79606 Pain in leg, unspecified: Secondary | ICD-10-CM | POA: Insufficient documentation

## 2011-10-05 DIAGNOSIS — M25559 Pain in unspecified hip: Secondary | ICD-10-CM | POA: Insufficient documentation

## 2011-10-05 NOTE — Progress Notes (Signed)
  Subjective:    Patient ID: Tammy Santiago, female    DOB: 02/20/1951, 61 y.o.   MRN: [id number]  HPI  Tammy Santiago comes in for left gluteal and hip pain that has been going on a few weeks and is severe.  She has been to the ER and had hip x-rays and they showed mild arthritis.  She describes pain in her left lower back that has started to improve.  However, she has severe pain in her left thigh.  She says she can barely walk due to the pain.  She denies any injury, any incontinence, any radicular symptoms.  However, she does say the thigh pain feels better after she urinates.   Review of Systems See HPI    Objective:   Physical Exam BP 165/98  Pulse 74  Temp 98.2 F (36.8 C) (Oral)  Ht 4\' 9"  (1.448 m)  Wt 136 lb 8 oz (61.916 kg)  BMI 29.54 kg/m2 General appearance: alert, cooperative and no distress Back:  +TTP in left lumbarsacral area, +muscle spasm Hips:  FROM, minimal discomfort with ROM, Abduction, adduction and IR strength is normal Left Thigh: patient has severe pain in left thigh when trying to lay down flat or straighten leg.  The pain is not in the quad tendon nor the quad origin at the hips- it is mid thigh.  She has severe TTP there.  There is no appreciable swelling, bruising in the thigh.  Pulses are normal in popliteal and DP/PT.  No signs of compartment syndrome.        Assessment & Plan:

## 2011-10-05 NOTE — Assessment & Plan Note (Signed)
Strange complaint of thigh pain improving with urination- however UA was normal.

## 2011-10-05 NOTE — Assessment & Plan Note (Signed)
This is minor, likely due to mild OA.  Advised patient that weight control was important for management of this problem.

## 2011-10-05 NOTE — Assessment & Plan Note (Signed)
Feel this is likely a muscle strain of some sort.  Will give Toradol injection today, and Rx naproxen and tramadol for pain.  Patient instructed activity as tolerated.  Follow up if not improving.

## 2011-10-10 ENCOUNTER — Ambulatory Visit (INDEPENDENT_AMBULATORY_CARE_PROVIDER_SITE_OTHER): Payer: Self-pay | Admitting: Family Medicine

## 2011-10-10 VITALS — BP 126/84 | HR 91 | Ht 59.0 in | Wt 139.0 lb

## 2011-10-10 DIAGNOSIS — R269 Unspecified abnormalities of gait and mobility: Secondary | ICD-10-CM | POA: Insufficient documentation

## 2011-10-10 DIAGNOSIS — R200 Anesthesia of skin: Secondary | ICD-10-CM | POA: Insufficient documentation

## 2011-10-10 DIAGNOSIS — R209 Unspecified disturbances of skin sensation: Secondary | ICD-10-CM

## 2011-10-10 NOTE — Assessment & Plan Note (Signed)
Did encourage her to remain as active as possible, but will write handicapped parking pass.

## 2011-10-10 NOTE — Progress Notes (Signed)
  Subjective:    Patient ID: Tammy Santiago, female    DOB: 05/09/1950, 61 y.o.   MRN: [id number]  HPI  [name] returns with numbness in her left leg.  I saw her in clinic last week for leg pain, and started her on naproxen and tramadol for the pain.  She is taking the naproxen twice a day, but says since she started it, she has had numbness in her left leg.  She denies weakness.  She has taken an occasional tramadol.  She has not had incontinence, or any injury.   She is also getting ready to start classes at [employer].  She walks with a limp and is worried about parking and getting around campus, she is wondering if I would write a handicap pass for her.   Review of Systems See HPI    Objective:   Physical Exam BP 126/84  Pulse 91  Ht 4\' 11"  (1.499 m)  Wt 139 lb (63.05 kg)  BMI 28.07 kg/m2 General appearance: alert, cooperative and no distress Legs: Strength is 5/5, reflexes 2+ and symmetric Patient reports decreased sensation of left leg compared to right  Gait: Patient walks with a limp, favoring right leg.         Assessment & Plan:

## 2011-10-10 NOTE — Assessment & Plan Note (Signed)
No red flags for cauda equina. Concerning that this started after scheduling NSAIDS, will d/c naproxen, use tramadol prn for pain.  I suspect there is a psychosocial component to her complaints, but will consider work up for disc problems, peripheral vascular disease, or neuropathy if problem does not improve with d/c of naproxen.

## 2011-10-10 NOTE — Patient Instructions (Signed)
Please Stop taking the Naproxen, the medication you take every day for the pain, and you can take the tramadol instead.  If your numbness does not get better, please call the office and let me know.

## 2011-11-12 ENCOUNTER — Telehealth: Payer: Self-pay | Admitting: Family Medicine

## 2011-11-12 DIAGNOSIS — I10 Essential (primary) hypertension: Secondary | ICD-10-CM

## 2011-11-12 MED ORDER — HYDROCHLOROTHIAZIDE 12.5 MG PO TABS: 12.5000 mg | ORAL_TABLET | Freq: Every day | ORAL | Status: DC

## 2011-11-12 NOTE — Telephone Encounter (Signed)
Rx sent 

## 2011-11-12 NOTE — Telephone Encounter (Signed)
Pt is asking to have a 3 mo supply filled for her HCTZ - [facility]  [facility]

## 2012-01-09 ENCOUNTER — Ambulatory Visit (INDEPENDENT_AMBULATORY_CARE_PROVIDER_SITE_OTHER): Payer: Self-pay | Admitting: Family Medicine

## 2012-01-09 ENCOUNTER — Encounter: Payer: Self-pay | Admitting: Family Medicine

## 2012-01-09 VITALS — BP 126/79 | HR 72 | Temp 98.6°F | Ht 59.0 in | Wt 141.0 lb

## 2012-01-09 DIAGNOSIS — E785 Hyperlipidemia, unspecified: Secondary | ICD-10-CM | POA: Insufficient documentation

## 2012-01-09 DIAGNOSIS — I10 Essential (primary) hypertension: Secondary | ICD-10-CM

## 2012-01-09 DIAGNOSIS — Z23 Encounter for immunization: Secondary | ICD-10-CM

## 2012-01-09 DIAGNOSIS — F329 Major depressive disorder, single episode, unspecified: Secondary | ICD-10-CM

## 2012-01-09 MED ORDER — SIMVASTATIN 20 MG PO TABS: 20.0000 mg | ORAL_TABLET | Freq: Every day | ORAL | Status: DC

## 2012-01-09 MED ORDER — HYDROCHLOROTHIAZIDE 12.5 MG PO TABS: 12.5000 mg | ORAL_TABLET | Freq: Every day | ORAL | Status: DC

## 2012-01-09 MED ORDER — CITALOPRAM HYDROBROMIDE 40 MG PO TABS: 40.0000 mg | ORAL_TABLET | Freq: Every day | ORAL | Status: DC

## 2012-01-09 MED ORDER — HYDROXYZINE HCL 10 MG PO TABS: 10.0000 mg | ORAL_TABLET | Freq: Three times a day (TID) | ORAL | Status: AC | PRN

## 2012-01-09 NOTE — Assessment & Plan Note (Signed)
Well controlled, continue HCTZ 

## 2012-01-09 NOTE — Assessment & Plan Note (Signed)
Will have her start taking simvastatin, follow up in 3 months for LDL check.

## 2012-01-09 NOTE — Assessment & Plan Note (Addendum)
Will increase to celexa 40 since patient still having symptoms.  Refill Hydroxyzine.

## 2012-01-09 NOTE — Progress Notes (Signed)
  Subjective:    Patient ID: Tammy Santiago, female    DOB: 1950-11-29, 61 y.o.   MRN: [id number]  HPI  [name] comes in for follow up  Anxiety- taking hydroxyzine, which does help with her anxiety.  She says she is less worried and more able to complete tasks.   Depression- taking celexa 20.  She says her mood is improved, she is not as sad as she was, and can do daily activities, and has more energy.  However, she still has difficulty with feeling sad some days, and especially has difficulty getting excited about seeing friends and family.   HTN- taking HCTZ without difficulty, not checking BP, but denies palpitations, LE edema, chest pain, dyspnea.   HLD- patient is not taking simvastatin, she did not know she was supposed to be taking it.  She is not getting much exercise or watching how much cholesterol she is eating.   Past Medical History  Diagnosis Date  . Hypertension   . Anxiety   . Depression    Family History  Problem Relation Age of Onset  . Alcohol abuse Mother   . Heart disease Mother   . Depression Mother   . Diabetes Mother   . Hyperlipidemia Mother   . Hypertension Mother    History  Substance Use Topics  . Smoking status: Former Games developer  . Smokeless tobacco: Not on file  . Alcohol Use: Yes     occasional     Review of Systems See HPI    Objective:   Physical Exam BP 126/79  Pulse 72  Temp 98.6 F (37 C) (Oral)  Ht 4\' 11"  (1.499 m)  Wt 141 lb (63.957 kg)  BMI 28.48 kg/m2 General appearance: alert, cooperative and no distress Lungs: clear to auscultation bilaterally Heart: regular rate and rhythm, S1, S2 normal, no murmur, click, rub or gallop Psych: normal mood, affect, judgement insight. No SI/HI.        Assessment & Plan:

## 2012-01-09 NOTE — Patient Instructions (Signed)
It was good to see you.  Your blood pressure today was BP: 126/79 mmHg.  Remember your goal blood pressure is about 130/80.  Please be sure to take your medication every day.  Great Job!  For your anxiety and depression- I want you to increase the Celexa from 20 mg daily to 40 mg daily.  You can take two of the pills at home until you run out and then get the new size pill from the pharmacy.  I have sent the hydroxyzine to the pharmacy.   Please start taking simvastatin for your cholesterol.  Also, try to increase your exercise to help lower your cholesterol.

## 2012-05-20 ENCOUNTER — Telehealth: Payer: Self-pay | Admitting: Family Medicine

## 2012-05-20 MED ORDER — HYDROXYZINE HCL 10 MG PO TABS: 10.0000 mg | ORAL_TABLET | Freq: Three times a day (TID) | ORAL | Status: DC | PRN

## 2012-05-20 NOTE — Telephone Encounter (Signed)
Will forward to Dr. Chamberlain 

## 2012-05-20 NOTE — Telephone Encounter (Signed)
Patient is calling asking for a new Rx for her anxiety medication of which she did not know the name of.  She was last seen in October and was to return in January so we scheduled her to be seen next Tuesday, but she would like enough of her medication sent to her pharmacy.

## 2012-05-20 NOTE — Telephone Encounter (Signed)
Rx sent in for Hydroxyzine.

## 2012-05-26 ENCOUNTER — Ambulatory Visit: Payer: Self-pay | Admitting: Family Medicine

## 2012-05-27 ENCOUNTER — Encounter: Payer: Self-pay | Admitting: Family Medicine

## 2012-05-27 ENCOUNTER — Ambulatory Visit (INDEPENDENT_AMBULATORY_CARE_PROVIDER_SITE_OTHER): Payer: Self-pay | Admitting: Family Medicine

## 2012-05-27 VITALS — BP 127/82 | HR 70 | Temp 98.1°F | Ht 59.0 in | Wt 142.0 lb

## 2012-05-27 DIAGNOSIS — I10 Essential (primary) hypertension: Secondary | ICD-10-CM

## 2012-05-27 DIAGNOSIS — F329 Major depressive disorder, single episode, unspecified: Secondary | ICD-10-CM

## 2012-05-27 DIAGNOSIS — N3281 Overactive bladder: Secondary | ICD-10-CM | POA: Insufficient documentation

## 2012-05-27 MED ORDER — HYDROXYZINE HCL 10 MG PO TABS: 10.0000 mg | ORAL_TABLET | Freq: Three times a day (TID) | ORAL | Status: DC | PRN

## 2012-05-27 MED ORDER — BENZOYL PEROXIDE-ERYTHROMYCIN 5-3 % EX GEL: Freq: Two times a day (BID) | CUTANEOUS | Status: DC

## 2012-05-27 MED ORDER — BACITRACIN 500 UNIT/GM EX OINT: 1.0000 "application " | TOPICAL_OINTMENT | Freq: Two times a day (BID) | CUTANEOUS | Status: DC

## 2012-05-27 NOTE — Assessment & Plan Note (Signed)
Well controlled, continue HCTZ, check BMET.

## 2012-05-27 NOTE — Progress Notes (Signed)
  Subjective:    Patient ID: Tammy Santiago, female    DOB: [birth year], 62 y.o.   MRN: [id number]  HPI:  Tammy Santiago comes in for follow up.    HTN: Taking HCTZ without difficulty.  Denies chest pain, dizziness, palpitations, LE edema.  Patient does not check blood pressures.   HLD: Patient is taking simvastatin without difficulty.  Last lipid profile was 11 months ago.  Patient is trying to make lifestyle modifications, she is doing some martial arts and walking for exercise.  She also recently got a puppy.   Bladder spasm: Patient says that she wakes up in the middle of the night and has to go to the bathroom suddenly and it is painful until she goes.  She has some trouble getting her urine stream started.  She has had a hysterectomy but has never had issues with vaginal prolapse.   Past Medical History  Diagnosis Date  . Hypertension   . Anxiety   . Depression     History  Substance Use Topics  . Smoking status: Former Games developer  . Smokeless tobacco: Not on file  . Alcohol Use: Yes     Comment: occasional    Family History  Problem Relation Age of Onset  . Alcohol abuse Mother   . Heart disease Mother   . Depression Mother   . Diabetes Mother   . Hyperlipidemia Mother   . Hypertension Mother    ROS Pertinent items in HPI    Objective:  Physical Exam:  BP 127/82  Pulse 70  Temp(Src) 98.1 F (36.7 C) (Oral)  Ht 4\' 11"  (1.499 m)  Wt 142 lb (64.411 kg)  BMI 28.67 kg/m2 General appearance: alert, cooperative and no distress Head: Normocephalic, without obvious abnormality, atraumatic Lungs: clear to auscultation bilaterally Heart: regular rate and rhythm, S1, S2 normal, no murmur, click, rub or gallop Pulses: 2+ and symmetric Pelvic: pt declines.       Assessment & Plan:

## 2012-05-27 NOTE — Assessment & Plan Note (Signed)
Well controlled on celexa and hydroxyzine.  She denies side effects, but I did let her know that I am concerned that hydroxyzine could be contributing to her bladder issues and if they worsen we may have to stop this medication/cut it back.

## 2012-05-27 NOTE — Assessment & Plan Note (Signed)
Taking simvastatin, will plan on checking fasting lipid profile in next month.

## 2012-05-27 NOTE — Assessment & Plan Note (Signed)
I suspect symptoms are overactive bladder based on history- she declines pelvic exam today to look for prolapse and also declines trying a medication for overactive bladder.  Will follow.

## 2012-05-27 NOTE — Patient Instructions (Signed)
Your blood pressure today was BP: 127/82 mmHg.  Remember your goal blood pressure is about 130/80.  Please be sure to take your medication every day.    Please make a lab appointment  for around April 9th to have your fasting blood work done.   I will send you a letter with your lab results, or call you if anything is abnormal.

## 2012-10-22 ENCOUNTER — Encounter: Payer: Self-pay | Admitting: Internal Medicine

## 2012-10-22 ENCOUNTER — Other Ambulatory Visit (INDEPENDENT_AMBULATORY_CARE_PROVIDER_SITE_OTHER): Payer: BC Managed Care – PPO

## 2012-10-22 ENCOUNTER — Ambulatory Visit (INDEPENDENT_AMBULATORY_CARE_PROVIDER_SITE_OTHER): Payer: BC Managed Care – PPO | Admitting: Internal Medicine

## 2012-10-22 VITALS — BP 122/74 | HR 66 | Temp 98.3°F | Resp 16 | Ht 59.0 in | Wt 141.0 lb

## 2012-10-22 DIAGNOSIS — I10 Essential (primary) hypertension: Secondary | ICD-10-CM

## 2012-10-22 DIAGNOSIS — B351 Tinea unguium: Secondary | ICD-10-CM

## 2012-10-22 DIAGNOSIS — Z1231 Encounter for screening mammogram for malignant neoplasm of breast: Secondary | ICD-10-CM | POA: Insufficient documentation

## 2012-10-22 DIAGNOSIS — E785 Hyperlipidemia, unspecified: Secondary | ICD-10-CM

## 2012-10-22 DIAGNOSIS — Z0000 Encounter for general adult medical examination without abnormal findings: Secondary | ICD-10-CM

## 2012-10-22 DIAGNOSIS — F329 Major depressive disorder, single episode, unspecified: Secondary | ICD-10-CM

## 2012-10-22 DIAGNOSIS — Z23 Encounter for immunization: Secondary | ICD-10-CM

## 2012-10-22 DIAGNOSIS — E559 Vitamin D deficiency, unspecified: Secondary | ICD-10-CM

## 2012-10-22 LAB — CBC WITH DIFFERENTIAL/PLATELET
Basophils Absolute: 0 10*3/uL (ref 0.0–0.1)
Eosinophils Absolute: 0.1 10*3/uL (ref 0.0–0.7)
HCT: 37.1 % (ref 36.0–46.0)
Lymphs Abs: 2.4 10*3/uL (ref 0.7–4.0)
MCHC: 33.5 g/dL (ref 30.0–36.0)
MCV: 85.4 fl (ref 78.0–100.0)
Monocytes Absolute: 0.4 10*3/uL (ref 0.1–1.0)
Platelets: 315 10*3/uL (ref 150.0–400.0)
RDW: 13.9 % (ref 11.5–14.6)

## 2012-10-22 LAB — LIPID PANEL
Cholesterol: 274 mg/dL — ABNORMAL HIGH (ref 0–200)
Triglycerides: 129 mg/dL (ref 0.0–149.0)

## 2012-10-22 LAB — URINALYSIS, ROUTINE W REFLEX MICROSCOPIC
Ketones, ur: NEGATIVE
Leukocytes, UA: NEGATIVE
Specific Gravity, Urine: 1.025 (ref 1.000–1.030)
Urine Glucose: NEGATIVE
pH: 7 (ref 5.0–8.0)

## 2012-10-22 LAB — COMPREHENSIVE METABOLIC PANEL
ALT: 13 U/L (ref 0–35)
CO2: 28 mEq/L (ref 19–32)
GFR: 116.5 mL/min (ref >60.00)
Sodium: 136 mEq/L (ref 135–145)
Total Bilirubin: 0.7 mg/dL (ref 0.3–1.2)
Total Protein: 7.7 g/dL (ref 6.0–8.3)

## 2012-10-22 LAB — TSH: TSH: 1.72 u[IU]/mL (ref 0.35–5.50)

## 2012-10-22 NOTE — Progress Notes (Signed)
Subjective:    Patient ID: Tammy Santiago, female    DOB: [birth year], 62 y.o.   MRN: [id number]  Hypertension This is a chronic problem. The current episode started more than 1 year ago. The problem has been gradually improving since onset. The problem is controlled. Associated symptoms include anxiety. Pertinent negatives include no blurred vision, chest pain, headaches, malaise/fatigue, neck pain, orthopnea, palpitations, peripheral edema, PND, shortness of breath or sweats. Past treatments include diuretics. The current treatment provides significant improvement. There are no compliance problems.       Review of Systems  Constitutional: Negative.  Negative for malaise/fatigue.  HENT: Negative.  Negative for neck pain.   Eyes: Negative.  Negative for blurred vision.  Respiratory: Negative.  Negative for apnea, cough, choking, chest tightness, shortness of breath, wheezing and stridor.   Cardiovascular: Negative.  Negative for chest pain, palpitations, orthopnea, leg swelling and PND.  Gastrointestinal: Negative.  Negative for nausea, vomiting, abdominal pain, diarrhea, constipation and abdominal distention.  Endocrine: Negative.   Genitourinary: Positive for frequency. Negative for dysuria, urgency, hematuria, flank pain, decreased urine volume, vaginal bleeding, vaginal discharge, enuresis, difficulty urinating, genital sores, vaginal pain, menstrual problem, pelvic pain and dyspareunia.  Musculoskeletal: Negative.  Negative for myalgias, back pain, joint swelling and gait problem.  Skin: Negative.   Allergic/Immunologic: Negative.   Neurological: Negative.  Negative for dizziness, tremors, weakness, light-headedness, numbness and headaches.  Hematological: Negative.  Negative for adenopathy. Does not bruise/bleed easily.  Psychiatric/Behavioral: Negative for suicidal ideas, hallucinations, behavioral problems, confusion, sleep disturbance, self-injury, decreased concentration and  agitation. The patient is nervous/anxious. The patient is not hyperactive.        Objective:   Physical Exam  Vitals reviewed. Constitutional: She is oriented to person, place, and time. She appears well-developed and well-nourished. No distress.  HENT:  Head: Normocephalic and atraumatic.  Mouth/Throat: Oropharynx is clear and moist. No oropharyngeal exudate.  Eyes: Conjunctivae are normal. Right eye exhibits no discharge. Left eye exhibits no discharge. No scleral icterus.  Neck: Normal range of motion. Neck supple. No JVD present. No tracheal deviation present. No thyromegaly present.  Cardiovascular: Normal rate, regular rhythm, normal heart sounds and intact distal pulses.  Exam reveals no gallop and no friction rub.   No murmur heard. Pulmonary/Chest: Effort normal and breath sounds normal. No stridor. No respiratory distress. She has no wheezes. She has no rales. Chest wall is not dull to percussion. She exhibits no mass, no tenderness, no bony tenderness, no laceration, no crepitus, no edema, no deformity, no swelling and no retraction. Right breast exhibits no inverted nipple, no mass, no nipple discharge, no skin change and no tenderness. Left breast exhibits no inverted nipple, no mass, no nipple discharge, no skin change and no tenderness. Breasts are symmetrical.  Abdominal: Soft. Bowel sounds are normal. She exhibits no distension and no mass. There is no tenderness. There is no rebound and no guarding.  Musculoskeletal: Normal range of motion. She exhibits no edema and no tenderness.  Lymphadenopathy:    She has no cervical adenopathy.  Neurological: She is oriented to person, place, and time.  Skin: Skin is warm and dry. No rash noted. She is not diaphoretic. No erythema. No pallor.  Psychiatric: She has a normal mood and affect. Her behavior is normal. Judgment and thought content normal.      Lab Results  Component Value Date   WBC 11.1* 01/09/2011   HGB 15.0  01/09/2011   HCT 44.0 01/09/2011   PLT  360 01/09/2011   GLUCOSE 83 06/18/2011   CHOL 218* 06/18/2011   TRIG 74 06/18/2011   HDL 74 06/18/2011   LDLCALC 213* 06/18/2011   ALT 11 06/18/2011   AST 21 06/18/2011   NA 142 06/18/2011   K 4.0 06/18/2011   CL 108 06/18/2011   CREATININE 0.67 06/18/2011   BUN 19 06/18/2011   CO2 22 06/18/2011   TSH 1.484 06/18/2011   HGBA1C 5.7 08/26/2008      Assessment & Plan:

## 2012-10-22 NOTE — Assessment & Plan Note (Signed)
Exam done Vaccines were updated She was referred for a mammo and colonoscopy Labs ordered Pt ed material was given 

## 2012-10-22 NOTE — Assessment & Plan Note (Signed)
I will recheck her Vit D level today 

## 2012-10-22 NOTE — Assessment & Plan Note (Signed)
She complains of severe urinary frequency so I have asked her to stop the HCTZ to see if that improves Today will check her labs to screen for end organ damage and secondary causes of HTN Will recheck her BP in two months and will treat if needed

## 2012-10-22 NOTE — Patient Instructions (Addendum)
Hypertension As your heart beats, it forces blood through your arteries. This force is your blood pressure. If the pressure is too high, it is called hypertension (HTN) or high blood pressure. HTN is dangerous because you may have it and not know it. High blood pressure may mean that your heart has to work harder to pump blood. Your arteries may be narrow or stiff. The extra work puts you at risk for heart disease, stroke, and other problems.  Blood pressure consists of two numbers, a higher number over a lower, 110/72, for example. It is stated as "110 over 72." The ideal is below 120 for the top number (systolic) and under 80 for the bottom (diastolic). Write down your blood pressure today. You should pay close attention to your blood pressure if you have certain conditions such as:  Heart failure.  Prior heart attack.  Diabetes  Chronic kidney disease.  Prior stroke.  Multiple risk factors for heart disease. To see if you have HTN, your blood pressure should be measured while you are seated with your arm held at the level of the heart. It should be measured at least twice. A one-time elevated blood pressure reading (especially in the Emergency Department) does not mean that you need treatment. There may be conditions in which the blood pressure is different between your right and left arms. It is important to see your caregiver soon for a recheck. Most people have essential hypertension which means that there is not a specific cause. This type of high blood pressure may be lowered by changing lifestyle factors such as:  Stress.  Smoking.  Lack of exercise.  Excessive weight.  Drug/tobacco/alcohol use.  Eating less salt. Most people do not have symptoms from high blood pressure until it has caused damage to the body. Effective treatment can often prevent, delay or reduce that damage. TREATMENT  When a cause has been identified, treatment for high blood pressure is directed at the  cause. There are a large number of medications to treat HTN. These fall into several categories, and your caregiver will help you select the medicines that are best for you. Medications may have side effects. You should review side effects with your caregiver. If your blood pressure stays high after you have made lifestyle changes or started on medicines,   Your medication(s) may need to be changed.  Other problems may need to be addressed.  Be certain you understand your prescriptions, and know how and when to take your medicine.  Be sure to follow up with your caregiver within the time frame advised (usually within two weeks) to have your blood pressure rechecked and to review your medications.  If you are taking more than one medicine to lower your blood pressure, make sure you know how and at what times they should be taken. Taking two medicines at the same time can result in blood pressure that is too low. SEEK IMMEDIATE MEDICAL CARE IF:  You develop a severe headache, blurred or changing vision, or confusion.  You have unusual weakness or numbness, or a faint feeling.  You have severe chest or abdominal pain, vomiting, or breathing problems. MAKE SURE YOU:   Understand these instructions.  Will watch your condition.  Will get help right away if you are not doing well or get worse. Document Released: 02/25/2005 Document Revised: 05/20/2011 Document Reviewed: 10/16/2007 [employer] Patient Information 2014 [employer]. Preventive Care for Adults, Female A healthy lifestyle and preventive care can promote health and wellness.   Preventive health guidelines for women include the following key practices.  A routine yearly physical is a good way to check with your caregiver about your health and preventive screening. It is a chance to share any concerns and updates on your health, and to receive a thorough exam.  Visit your dentist for a routine exam and preventive care every 6  months. Brush your teeth twice a day and floss once a day. Good oral hygiene prevents tooth decay and gum disease.  The frequency of eye exams is based on your age, health, family medical history, use of contact lenses, and other factors. Follow your caregiver's recommendations for frequency of eye exams.  Eat a healthy diet. Foods like vegetables, fruits, whole grains, low-fat dairy products, and lean protein foods contain the nutrients you need without too many calories. Decrease your intake of foods high in solid fats, added sugars, and salt. Eat the right amount of calories for you.Get information about a proper diet from your caregiver, if necessary.  Regular physical exercise is one of the most important things you can do for your health. Most adults should get at least 150 minutes of moderate-intensity exercise (any activity that increases your heart rate and causes you to sweat) each week. In addition, most adults need muscle-strengthening exercises on 2 or more days a week.  Maintain a healthy weight. The body mass index (BMI) is a screening tool to identify possible weight problems. It provides an estimate of body fat based on height and weight. Your caregiver can help determine your BMI, and can help you achieve or maintain a healthy weight.For adults 20 years and older:  A BMI below 18.5 is considered underweight.  A BMI of 18.5 to 24.9 is normal.  A BMI of 25 to 29.9 is considered overweight.  A BMI of 30 and above is considered obese.  Maintain normal blood lipids and cholesterol levels by exercising and minimizing your intake of saturated fat. Eat a balanced diet with plenty of fruit and vegetables. Blood tests for lipids and cholesterol should begin at age 20 and be repeated every 5 years. If your lipid or cholesterol levels are high, you are over 50, or you are at high risk for heart disease, you may need your cholesterol levels checked more frequently.Ongoing high lipid and  cholesterol levels should be treated with medicines if diet and exercise are not effective.  If you smoke, find out from your caregiver how to quit. If you do not use tobacco, do not start.  If you are pregnant, do not drink alcohol. If you are breastfeeding, be very cautious about drinking alcohol. If you are not pregnant and choose to drink alcohol, do not exceed 1 drink per day. One drink is considered to be 12 ounces (355 mL) of beer, 5 ounces (148 mL) of wine, or 1.5 ounces (44 mL) of liquor.  Avoid use of street drugs. Do not share needles with anyone. Ask for help if you need support or instructions about stopping the use of drugs.  High blood pressure causes heart disease and increases the risk of stroke. Your blood pressure should be checked at least every 1 to 2 years. Ongoing high blood pressure should be treated with medicines if weight loss and exercise are not effective.  If you are 55 to 62 years old, ask your caregiver if you should take aspirin to prevent strokes.  Diabetes screening involves taking a blood sample to check your fasting blood sugar level. This should   be done once every 3 years, after age 45, if you are within normal weight and without risk factors for diabetes. Testing should be considered at a younger age or be carried out more frequently if you are overweight and have at least 1 risk factor for diabetes.  Breast cancer screening is essential preventive care for women. You should practice "breast self-awareness." This means understanding the normal appearance and feel of your breasts and may include breast self-examination. Any changes detected, no matter how small, should be reported to a caregiver. Women in their 20s and 30s should have a clinical breast exam (CBE) by a caregiver as part of a regular health exam every 1 to 3 years. After age 40, women should have a CBE every year. Starting at age 40, women should consider having a mammography (breast X-ray test)  every year. Women who have a family history of breast cancer should talk to their caregiver about genetic screening. Women at a high risk of breast cancer should talk to their caregivers about having magnetic resonance imaging (MRI) and a mammography every year.  The Pap test is a screening test for cervical cancer. A Pap test can show cell changes on the cervix that might become cervical cancer if left untreated. A Pap test is a procedure in which cells are obtained and examined from the lower end of the uterus (cervix).  Women should have a Pap test starting at age 21.  Between ages 21 and 29, Pap tests should be repeated every 2 years.  Beginning at age 30, you should have a Pap test every 3 years as long as the past 3 Pap tests have been normal.  Some women have medical problems that increase the chance of getting cervical cancer. Talk to your caregiver about these problems. It is especially important to talk to your caregiver if a new problem develops soon after your last Pap test. In these cases, your caregiver may recommend more frequent screening and Pap tests.  The above recommendations are the same for women who have or have not gotten the vaccine for human papillomavirus (HPV).  If you had a hysterectomy for a problem that was not cancer or a condition that could lead to cancer, then you no longer need Pap tests. Even if you no longer need a Pap test, a regular exam is a good idea to make sure no other problems are starting.  If you are between ages 65 and 70, and you have had normal Pap tests going back 10 years, you no longer need Pap tests. Even if you no longer need a Pap test, a regular exam is a good idea to make sure no other problems are starting.  If you have had past treatment for cervical cancer or a condition that could lead to cancer, you need Pap tests and screening for cancer for at least 20 years after your treatment.  If Pap tests have been discontinued, risk factors  (such as a new sexual partner) need to be reassessed to determine if screening should be resumed.  The HPV test is an additional test that may be used for cervical cancer screening. The HPV test looks for the virus that can cause the cell changes on the cervix. The cells collected during the Pap test can be tested for HPV. The HPV test could be used to screen women aged 30 years and older, and should be used in women of any age who have unclear Pap test results. After the   age of 30, women should have HPV testing at the same frequency as a Pap test.  Colorectal cancer can be detected and often prevented. Most routine colorectal cancer screening begins at the age of 50 and continues through age 75. However, your caregiver may recommend screening at an earlier age if you have risk factors for colon cancer. On a yearly basis, your caregiver may provide home test kits to check for hidden blood in the stool. Use of a small camera at the end of a tube, to directly examine the colon (sigmoidoscopy or colonoscopy), can detect the earliest forms of colorectal cancer. Talk to your caregiver about this at age 50, when routine screening begins. Direct examination of the colon should be repeated every 5 to 10 years through age 75, unless early forms of pre-cancerous polyps or small growths are found.  Hepatitis C blood testing is recommended for all people born from 1945 through 1965 and any individual with known risks for hepatitis C.  Practice safe sex. Use condoms and avoid high-risk sexual practices to reduce the spread of sexually transmitted infections (STIs). STIs include gonorrhea, chlamydia, syphilis, trichomonas, herpes, HPV, and human immunodeficiency virus (HIV). Herpes, HIV, and HPV are viral illnesses that have no cure. They can result in disability, cancer, and death. Sexually active women aged 25 and younger should be checked for chlamydia. Older women with new or multiple partners should also be tested  for chlamydia. Testing for other STIs is recommended if you are sexually active and at increased risk.  Osteoporosis is a disease in which the bones lose minerals and strength with aging. This can result in serious bone fractures. The risk of osteoporosis can be identified using a bone density scan. Women ages 65 and over and women at risk for fractures or osteoporosis should discuss screening with their caregivers. Ask your caregiver whether you should take a calcium supplement or vitamin D to reduce the rate of osteoporosis.  Menopause can be associated with physical symptoms and risks. Hormone replacement therapy is available to decrease symptoms and risks. You should talk to your caregiver about whether hormone replacement therapy is right for you.  Use sunscreen with sun protection factor (SPF) of 30 or more. Apply sunscreen liberally and repeatedly throughout the day. You should seek shade when your shadow is shorter than you. Protect yourself by wearing long sleeves, pants, a wide-brimmed hat, and sunglasses year round, whenever you are outdoors.  Once a month, do a whole body skin exam, using a mirror to look at the skin on your back. Notify your caregiver of new moles, moles that have irregular borders, moles that are larger than a pencil eraser, or moles that have changed in shape or color.  Stay current with required immunizations.  Influenza. You need a dose every fall (or winter). The composition of the flu vaccine changes each year, so being vaccinated once is not enough.  Pneumococcal polysaccharide. You need 1 to 2 doses if you smoke cigarettes or if you have certain chronic medical conditions. You need 1 dose at age 65 (or older) if you have never been vaccinated.  Tetanus, diphtheria, pertussis (Tdap, Td). Get 1 dose of Tdap vaccine if you are younger than age 65, are over 65 and have contact with an infant, are a healthcare worker, are pregnant, or simply want to be protected from  whooping cough. After that, you need a Td booster dose every 10 years. Consult your caregiver if you have not had at least   3 tetanus and diphtheria-containing shots sometime in your life or have a deep or dirty wound.  HPV. You need this vaccine if you are a woman age 26 or younger. The vaccine is given in 3 doses over 6 months.  Measles, mumps, rubella (MMR). You need at least 1 dose of MMR if you were born in 1957 or later. You may also need a second dose.  Meningococcal. If you are age 19 to 21 and a first-year college student living in a residence hall, or have one of several medical conditions, you need to get vaccinated against meningococcal disease. You may also need additional booster doses.  Zoster (shingles). If you are age 60 or older, you should get this vaccine.  Varicella (chickenpox). If you have never had chickenpox or you were vaccinated but received only 1 dose, talk to your caregiver to find out if you need this vaccine.  Hepatitis A. You need this vaccine if you have a specific risk factor for hepatitis A virus infection or you simply wish to be protected from this disease. The vaccine is usually given as 2 doses, 6 to 18 months apart.  Hepatitis B. You need this vaccine if you have a specific risk factor for hepatitis B virus infection or you simply wish to be protected from this disease. The vaccine is given in 3 doses, usually over 6 months. Preventive Services / Frequency Ages 19 to 39  Blood pressure check.** / Every 1 to 2 years.  Lipid and cholesterol check.** / Every 5 years beginning at age 20.  Clinical breast exam.** / Every 3 years for women in their 20s and 30s.  Pap test.** / Every 2 years from ages 21 through 29. Every 3 years starting at age 30 through age 65 or 70 with a history of 3 consecutive normal Pap tests.  HPV screening.** / Every 3 years from ages 30 through ages 65 to 70 with a history of 3 consecutive normal Pap tests.  Hepatitis C blood  test.** / For any individual with known risks for hepatitis C.  Skin self-exam. / Monthly.  Influenza immunization.** / Every year.  Pneumococcal polysaccharide immunization.** / 1 to 2 doses if you smoke cigarettes or if you have certain chronic medical conditions.  Tetanus, diphtheria, pertussis (Tdap, Td) immunization. / A one-time dose of Tdap vaccine. After that, you need a Td booster dose every 10 years.  HPV immunization. / 3 doses over 6 months, if you are 26 and younger.  Measles, mumps, rubella (MMR) immunization. / You need at least 1 dose of MMR if you were born in 1957 or later. You may also need a second dose.  Meningococcal immunization. / 1 dose if you are age 19 to 21 and a first-year college student living in a residence hall, or have one of several medical conditions, you need to get vaccinated against meningococcal disease. You may also need additional booster doses.  Varicella immunization.** / Consult your caregiver.  Hepatitis A immunization.** / Consult your caregiver. 2 doses, 6 to 18 months apart.  Hepatitis B immunization.** / Consult your caregiver. 3 doses usually over 6 months. Ages 40 to 64  Blood pressure check.** / Every 1 to 2 years.  Lipid and cholesterol check.** / Every 5 years beginning at age 20.  Clinical breast exam.** / Every year after age 40.  Mammogram.** / Every year beginning at age 40 and continuing for as long as you are in good health. Consult with your caregiver.    Pap test.** / Every 3 years starting at age 30 through age 65 or 70 with a history of 3 consecutive normal Pap tests.  HPV screening.** / Every 3 years from ages 30 through ages 65 to 70 with a history of 3 consecutive normal Pap tests.  Fecal occult blood test (FOBT) of stool. / Every year beginning at age 50 and continuing until age 75. You may not need to do this test if you get a colonoscopy every 10 years.  Flexible sigmoidoscopy or colonoscopy.** / Every 5 years  for a flexible sigmoidoscopy or every 10 years for a colonoscopy beginning at age 50 and continuing until age 75.  Hepatitis C blood test.** / For all people born from 1945 through 1965 and any individual with known risks for hepatitis C.  Skin self-exam. / Monthly.  Influenza immunization.** / Every year.  Pneumococcal polysaccharide immunization.** / 1 to 2 doses if you smoke cigarettes or if you have certain chronic medical conditions.  Tetanus, diphtheria, pertussis (Tdap, Td) immunization.** / A one-time dose of Tdap vaccine. After that, you need a Td booster dose every 10 years.  Measles, mumps, rubella (MMR) immunization. / You need at least 1 dose of MMR if you were born in 1957 or later. You may also need a second dose.  Varicella immunization.** / Consult your caregiver.  Meningococcal immunization.** / Consult your caregiver.  Hepatitis A immunization.** / Consult your caregiver. 2 doses, 6 to 18 months apart.  Hepatitis B immunization.** / Consult your caregiver. 3 doses, usually over 6 months. Ages 65 and over  Blood pressure check.** / Every 1 to 2 years.  Lipid and cholesterol check.** / Every 5 years beginning at age 20.  Clinical breast exam.** / Every year after age 40.  Mammogram.** / Every year beginning at age 40 and continuing for as long as you are in good health. Consult with your caregiver.  Pap test.** / Every 3 years starting at age 30 through age 65 or 70 with a 3 consecutive normal Pap tests. Testing can be stopped between 65 and 70 with 3 consecutive normal Pap tests and no abnormal Pap or HPV tests in the past 10 years.  HPV screening.** / Every 3 years from ages 30 through ages 65 or 70 with a history of 3 consecutive normal Pap tests. Testing can be stopped between 65 and 70 with 3 consecutive normal Pap tests and no abnormal Pap or HPV tests in the past 10 years.  Fecal occult blood test (FOBT) of stool. / Every year beginning at age 50 and  continuing until age 75. You may not need to do this test if you get a colonoscopy every 10 years.  Flexible sigmoidoscopy or colonoscopy.** / Every 5 years for a flexible sigmoidoscopy or every 10 years for a colonoscopy beginning at age 50 and continuing until age 75.  Hepatitis C blood test.** / For all people born from 1945 through 1965 and any individual with known risks for hepatitis C.  Osteoporosis screening.** / A one-time screening for women ages 65 and over and women at risk for fractures or osteoporosis.  Skin self-exam. / Monthly.  Influenza immunization.** / Every year.  Pneumococcal polysaccharide immunization.** / 1 dose at age 65 (or older) if you have never been vaccinated.  Tetanus, diphtheria, pertussis (Tdap, Td) immunization. / A one-time dose of Tdap vaccine if you are over 65 and have contact with an infant, are a healthcare worker, or simply want to be   protected from whooping cough. After that, you need a Td booster dose every 10 years.  Varicella immunization.** / Consult your caregiver.  Meningococcal immunization.** / Consult your caregiver.  Hepatitis A immunization.** / Consult your caregiver. 2 doses, 6 to 18 months apart.  Hepatitis B immunization.** / Check with your caregiver. 3 doses, usually over 6 months. ** Family history and personal history of risk and conditions may change your caregiver's recommendations. Document Released: 04/23/2001 Document Revised: 05/20/2011 Document Reviewed: 07/23/2010 [employer] Patient Information 2014 [employer].  

## 2012-10-22 NOTE — Assessment & Plan Note (Signed)
Her toenails are very dark, thick, dystrophic I have asked her to see podiatry

## 2012-10-22 NOTE — Assessment & Plan Note (Signed)
FLP today She is tolerating the simvastatin well

## 2012-10-23 ENCOUNTER — Encounter: Payer: Self-pay | Admitting: Internal Medicine

## 2012-10-23 LAB — HEPATITIS C ANTIBODY: HCV Ab: NEGATIVE

## 2012-10-23 MED ORDER — EZETIMIBE 10 MG PO TABS: 10.0000 mg | ORAL_TABLET | Freq: Every day | ORAL | Status: DC

## 2012-10-23 NOTE — Addendum Note (Signed)
Addended by: Etta Grandchild on: 10/23/2012 07:34 AM   Modules accepted: Orders

## 2012-10-28 ENCOUNTER — Ambulatory Visit (INDEPENDENT_AMBULATORY_CARE_PROVIDER_SITE_OTHER)
Admission: RE | Admit: 2012-10-28 | Discharge: 2012-10-28 | Disposition: A | Discharge status: Home / Self Care | Payer: BC Managed Care – PPO | Source: Ambulatory Visit | Attending: Internal Medicine | Admitting: Internal Medicine

## 2012-10-28 DIAGNOSIS — E559 Vitamin D deficiency, unspecified: Secondary | ICD-10-CM

## 2012-11-06 ENCOUNTER — Ambulatory Visit
Admission: RE | Admit: 2012-11-06 | Discharge: 2012-11-06 | Disposition: A | Discharge status: Home / Self Care | Payer: BC Managed Care – PPO | Source: Ambulatory Visit | Attending: Internal Medicine | Admitting: Internal Medicine

## 2012-11-06 DIAGNOSIS — Z1231 Encounter for screening mammogram for malignant neoplasm of breast: Secondary | ICD-10-CM

## 2012-12-03 ENCOUNTER — Encounter: Payer: Self-pay | Admitting: Gastroenterology

## 2013-01-12 ENCOUNTER — Other Ambulatory Visit: Payer: Self-pay | Admitting: Family Medicine

## 2013-01-14 ENCOUNTER — Telehealth: Payer: Self-pay | Admitting: *Deleted

## 2013-01-14 ENCOUNTER — Other Ambulatory Visit: Payer: Self-pay | Admitting: Family Medicine

## 2013-01-14 DIAGNOSIS — I10 Essential (primary) hypertension: Secondary | ICD-10-CM

## 2013-01-14 DIAGNOSIS — F329 Major depressive disorder, single episode, unspecified: Secondary | ICD-10-CM

## 2013-01-14 DIAGNOSIS — F3289 Other specified depressive episodes: Secondary | ICD-10-CM

## 2013-01-14 MED ORDER — CITALOPRAM HYDROBROMIDE 40 MG PO TABS: 40.0000 mg | ORAL_TABLET | Freq: Every day | ORAL | Status: DC

## 2013-01-14 MED ORDER — HYDROCHLOROTHIAZIDE 12.5 MG PO TABS: 12.5000 mg | ORAL_TABLET | Freq: Every day | ORAL | Status: DC

## 2013-01-14 NOTE — Telephone Encounter (Signed)
done

## 2013-01-14 NOTE — Telephone Encounter (Signed)
Left message on VM that rx sent

## 2013-01-14 NOTE — Telephone Encounter (Signed)
Pt called requesting HCTZ refill, states she never stopped taking medication as advised in 10/2012.  Pt also requesting Citalopram Refill.  Please advise

## 2013-02-12 ENCOUNTER — Other Ambulatory Visit: Payer: Self-pay | Admitting: Family Medicine

## 2013-02-15 ENCOUNTER — Telehealth: Payer: Self-pay | Admitting: *Deleted

## 2013-02-15 DIAGNOSIS — F329 Major depressive disorder, single episode, unspecified: Secondary | ICD-10-CM

## 2013-02-15 MED ORDER — CITALOPRAM HYDROBROMIDE 40 MG PO TABS: 40.0000 mg | ORAL_TABLET | Freq: Every day | ORAL | Status: DC

## 2013-02-15 NOTE — Telephone Encounter (Signed)
Pt called requesting Celexa refill.  Last OV 8.14.14.  please advise

## 2013-02-16 ENCOUNTER — Telehealth: Payer: Self-pay | Admitting: *Deleted

## 2013-02-16 NOTE — Telephone Encounter (Signed)
Please advise in Dr Jones absence 

## 2013-02-16 NOTE — Telephone Encounter (Signed)
OK to fill this prescription with additional refills x0 Thank you!  

## 2013-02-16 NOTE — Telephone Encounter (Signed)
Pt called requesting Hydroxyzine refill.  Please adive

## 2013-02-17 MED ORDER — HYDROXYZINE HCL 10 MG PO TABS: 10.0000 mg | ORAL_TABLET | Freq: Three times a day (TID) | ORAL | Status: DC | PRN

## 2013-02-17 NOTE — Telephone Encounter (Signed)
Left message Rx sent

## 2013-06-03 ENCOUNTER — Ambulatory Visit (INDEPENDENT_AMBULATORY_CARE_PROVIDER_SITE_OTHER): Payer: BC Managed Care – PPO | Admitting: Internal Medicine

## 2013-06-03 ENCOUNTER — Encounter: Payer: Self-pay | Admitting: Internal Medicine

## 2013-06-03 VITALS — BP 118/62 | HR 73 | Temp 98.7°F | Resp 14 | Ht <= 58 in | Wt 141.4 lb

## 2013-06-03 DIAGNOSIS — S43499A Other sprain of unspecified shoulder joint, initial encounter: Secondary | ICD-10-CM

## 2013-06-03 DIAGNOSIS — R269 Unspecified abnormalities of gait and mobility: Secondary | ICD-10-CM

## 2013-06-03 DIAGNOSIS — N318 Other neuromuscular dysfunction of bladder: Secondary | ICD-10-CM

## 2013-06-03 DIAGNOSIS — M5416 Radiculopathy, lumbar region: Secondary | ICD-10-CM

## 2013-06-03 DIAGNOSIS — S46819A Strain of other muscles, fascia and tendons at shoulder and upper arm level, unspecified arm, initial encounter: Secondary | ICD-10-CM

## 2013-06-03 DIAGNOSIS — IMO0002 Reserved for concepts with insufficient information to code with codable children: Secondary | ICD-10-CM

## 2013-06-03 DIAGNOSIS — N319 Neuromuscular dysfunction of bladder, unspecified: Secondary | ICD-10-CM

## 2013-06-03 DIAGNOSIS — S46311A Strain of muscle, fascia and tendon of triceps, right arm, initial encounter: Secondary | ICD-10-CM

## 2013-06-03 MED ORDER — GABAPENTIN 100 MG PO CAPS: ORAL_CAPSULE | ORAL | Status: DC

## 2013-06-03 NOTE — Progress Notes (Signed)
   Subjective:    Patient ID: Tammy Santiago, female    DOB: Jan 22, 1951, 63 y.o.   MRN: [id number]  HPI  Her symptoms began 3 weeks ago as pain in the right triceps area after lifting 5 pound weights repetitively in exercise class . She has stopped lifting weights and has been employing nonsteroidals and Tylenol without benefit.  There was no associated neck or upper back discomfort.  Unrelated is some pain in the lumbosacral area with radiation into the right lower extremity when she gets up at night to urinate. She also has the back pain when she strains to urinate. That has been a recurrent phenomena over the last 2-3 years.  She states that she does have trouble starting her urine and must perform breathing exercises or isometrics to initiate urine stream.      Review of Systems  She is not having fever, chills, sweats, or unexplained weight loss  There's been no incontinence of urine or stool.  She denies specific dysuria, pyuria, or hematuria.   She has noted intermittent swelling of her hands, toes, knees. The right knee has been stiff.  She also noted some numbness and left hand.  She denies any associated chest pain or palpitations.  Gait is been  unsteady with some falls.     Objective:   Physical Exam  She appears well-nourished in no distress. She appears younger than stated age.Wearing wig  She has no lymphadenopathy about the neck or axilla.  There is full range of motion of the neck without associated radicular pain  Deep tendon reflexes, strength, tone are normal  There is pain in the triceps area with opposition maneuvers and with palpation of the triceps muscle.  She is able to lie flat and sit up without help. Straight leg raising is negative  Her gait is slightly unsteady.  Romberg testing and finger to nose testing are negative  Heart rhythm is regular; she has no significant murmurs  Chest is clear with no increased work of  breathing  Abdominal exam reveals no AAA or masses        Assessment & Plan:  #1 muscular pain the triceps in the context of repetitive lifting  #2 lumbosacral area pain with radiculopathy symptoms in the right lower extremity  #3 bladder dysfunction, possibly related to #2  #4 unsteady gait in context of  # 2 & prn Atarax  Plan: If she fails to respond to topical measures to the triceps; Sports Medicine referral  LS spine films will be performed. Neurologic consultation requested

## 2013-06-03 NOTE — Patient Instructions (Signed)
Use an anti-inflammatory cream such as Aspercreme or Zostrix cream twice a day to the affected area as needed. In lieu of this warm moist compresses or  hot water bottle can be used. Do not apply ice . The Neurology referral will be scheduled and you'll be notified of the time.

## 2013-06-03 NOTE — Progress Notes (Signed)
Pre visit review using our clinic review tool, if applicable. No additional management support is needed unless otherwise documented below in the visit note. 

## 2013-06-07 ENCOUNTER — Ambulatory Visit (INDEPENDENT_AMBULATORY_CARE_PROVIDER_SITE_OTHER)
Admission: RE | Admit: 2013-06-07 | Discharge: 2013-06-07 | Disposition: A | Discharge status: Home / Self Care | Payer: BC Managed Care – PPO | Source: Ambulatory Visit | Attending: Internal Medicine | Admitting: Internal Medicine

## 2013-06-07 DIAGNOSIS — M5416 Radiculopathy, lumbar region: Secondary | ICD-10-CM

## 2013-06-07 DIAGNOSIS — IMO0002 Reserved for concepts with insufficient information to code with codable children: Secondary | ICD-10-CM

## 2013-07-13 ENCOUNTER — Ambulatory Visit: Payer: BC Managed Care – PPO | Admitting: Neurology

## 2013-07-20 ENCOUNTER — Other Ambulatory Visit: Payer: Self-pay | Admitting: Family Medicine

## 2013-07-20 ENCOUNTER — Telehealth: Payer: Self-pay | Admitting: Internal Medicine

## 2013-07-20 DIAGNOSIS — I10 Essential (primary) hypertension: Secondary | ICD-10-CM

## 2013-07-20 MED ORDER — HYDROCHLOROTHIAZIDE 12.5 MG PO TABS: 12.5000 mg | ORAL_TABLET | Freq: Every day | ORAL | Status: DC

## 2013-07-20 NOTE — Telephone Encounter (Signed)
Requesting a refill on hydrodiuril.

## 2013-08-04 ENCOUNTER — Emergency Department (HOSPITAL_COMMUNITY): Payer: BC Managed Care – PPO

## 2013-08-04 ENCOUNTER — Encounter (HOSPITAL_COMMUNITY): Payer: Self-pay | Admitting: Emergency Medicine

## 2013-08-04 ENCOUNTER — Inpatient Hospital Stay (HOSPITAL_COMMUNITY)
Admission: EM | Admit: 2013-08-04 | Discharge: 2013-08-10 | DRG: 088 | Disposition: A | Discharge status: Rehabilitation Facility | Payer: BC Managed Care – PPO | Attending: General Surgery | Admitting: General Surgery

## 2013-08-04 DIAGNOSIS — S060X9A Concussion with loss of consciousness of unspecified duration, initial encounter: Principal | ICD-10-CM | POA: Diagnosis present

## 2013-08-04 DIAGNOSIS — S32509A Unspecified fracture of unspecified pubis, initial encounter for closed fracture: Secondary | ICD-10-CM | POA: Diagnosis present

## 2013-08-04 DIAGNOSIS — S82409A Unspecified fracture of shaft of unspecified fibula, initial encounter for closed fracture: Secondary | ICD-10-CM | POA: Diagnosis present

## 2013-08-04 DIAGNOSIS — Y92410 Unspecified street and highway as the place of occurrence of the external cause: Secondary | ICD-10-CM

## 2013-08-04 DIAGNOSIS — S322XXA Fracture of coccyx, initial encounter for closed fracture: Secondary | ICD-10-CM

## 2013-08-04 DIAGNOSIS — M549 Dorsalgia, unspecified: Secondary | ICD-10-CM

## 2013-08-04 DIAGNOSIS — M542 Cervicalgia: Secondary | ICD-10-CM

## 2013-08-04 DIAGNOSIS — S161XXA Strain of muscle, fascia and tendon at neck level, initial encounter: Secondary | ICD-10-CM | POA: Diagnosis present

## 2013-08-04 DIAGNOSIS — R079 Chest pain, unspecified: Secondary | ICD-10-CM

## 2013-08-04 DIAGNOSIS — S3282XA Multiple fractures of pelvis without disruption of pelvic ring, initial encounter for closed fracture: Secondary | ICD-10-CM

## 2013-08-04 DIAGNOSIS — S82402A Unspecified fracture of shaft of left fibula, initial encounter for closed fracture: Secondary | ICD-10-CM | POA: Diagnosis present

## 2013-08-04 DIAGNOSIS — R339 Retention of urine, unspecified: Secondary | ICD-10-CM | POA: Diagnosis present

## 2013-08-04 DIAGNOSIS — S32409A Unspecified fracture of unspecified acetabulum, initial encounter for closed fracture: Secondary | ICD-10-CM | POA: Diagnosis present

## 2013-08-04 DIAGNOSIS — E876 Hypokalemia: Secondary | ICD-10-CM | POA: Diagnosis not present

## 2013-08-04 DIAGNOSIS — I10 Essential (primary) hypertension: Secondary | ICD-10-CM | POA: Diagnosis present

## 2013-08-04 DIAGNOSIS — S060XAA Concussion with loss of consciousness status unknown, initial encounter: Secondary | ICD-10-CM

## 2013-08-04 DIAGNOSIS — D62 Acute posthemorrhagic anemia: Secondary | ICD-10-CM | POA: Diagnosis present

## 2013-08-04 DIAGNOSIS — S3210XA Unspecified fracture of sacrum, initial encounter for closed fracture: Secondary | ICD-10-CM | POA: Diagnosis present

## 2013-08-04 DIAGNOSIS — IMO0002 Reserved for concepts with insufficient information to code with codable children: Secondary | ICD-10-CM | POA: Diagnosis present

## 2013-08-04 DIAGNOSIS — Z8249 Family history of ischemic heart disease and other diseases of the circulatory system: Secondary | ICD-10-CM

## 2013-08-04 DIAGNOSIS — Z87891 Personal history of nicotine dependence: Secondary | ICD-10-CM

## 2013-08-04 DIAGNOSIS — M47812 Spondylosis without myelopathy or radiculopathy, cervical region: Secondary | ICD-10-CM | POA: Diagnosis present

## 2013-08-04 DIAGNOSIS — M5030 Other cervical disc degeneration, unspecified cervical region: Secondary | ICD-10-CM | POA: Diagnosis present

## 2013-08-04 DIAGNOSIS — S20219A Contusion of unspecified front wall of thorax, initial encounter: Secondary | ICD-10-CM | POA: Diagnosis present

## 2013-08-04 DIAGNOSIS — S329XXA Fracture of unspecified parts of lumbosacral spine and pelvis, initial encounter for closed fracture: Secondary | ICD-10-CM | POA: Diagnosis present

## 2013-08-04 DIAGNOSIS — Z833 Family history of diabetes mellitus: Secondary | ICD-10-CM

## 2013-08-04 DIAGNOSIS — S139XXA Sprain of joints and ligaments of unspecified parts of neck, initial encounter: Secondary | ICD-10-CM | POA: Diagnosis present

## 2013-08-04 HISTORY — PX: OTHER SURGICAL HISTORY: SHX169

## 2013-08-04 LAB — SAMPLE TO BLOOD BANK

## 2013-08-04 LAB — I-STAT CHEM 8, ED
BUN: 19 mg/dL (ref 6–23)
CALCIUM ION: 1.2 mmol/L (ref 1.13–1.30)
CHLORIDE: 99 meq/L (ref 96–112)
Creatinine, Ser: 0.9 mg/dL (ref 0.50–1.10)
GLUCOSE: 116 mg/dL — AB (ref 70–99)
HEMATOCRIT: 41 % (ref 36.0–46.0)
HEMOGLOBIN: 13.9 g/dL (ref 12.0–15.0)
Potassium: 3.1 mEq/L — ABNORMAL LOW (ref 3.7–5.3)
Sodium: 140 mEq/L (ref 137–147)
TCO2: 24 mmol/L (ref 0–100)

## 2013-08-04 LAB — CBC
HCT: 37.6 % (ref 36.0–46.0)
Hemoglobin: 12.4 g/dL (ref 12.0–15.0)
MCH: 28.4 pg (ref 26.0–34.0)
MCHC: 33 g/dL (ref 30.0–36.0)
MCV: 86 fL (ref 78.0–100.0)
Platelets: 292 10*3/uL (ref 150–400)
RBC: 4.37 MIL/uL (ref 3.87–5.11)
RDW: 13.4 % (ref 11.5–15.5)
WBC: 10.4 10*3/uL (ref 4.0–10.5)

## 2013-08-04 LAB — CDS SEROLOGY

## 2013-08-04 LAB — COMPREHENSIVE METABOLIC PANEL
ALK PHOS: 51 U/L (ref 39–117)
ALT: 22 U/L (ref 0–35)
AST: 40 U/L — ABNORMAL HIGH (ref 0–37)
Albumin: 3.9 g/dL (ref 3.5–5.2)
BILIRUBIN TOTAL: 0.5 mg/dL (ref 0.3–1.2)
BUN: 19 mg/dL (ref 6–23)
CHLORIDE: 97 meq/L (ref 96–112)
CO2: 22 mEq/L (ref 19–32)
CREATININE: 0.75 mg/dL (ref 0.50–1.10)
Calcium: 10 mg/dL (ref 8.4–10.5)
GFR calc Af Amer: >90 mL/min (ref >90)
GFR, EST NON AFRICAN AMERICAN: 88 mL/min — AB (ref >90)
GLUCOSE: 120 mg/dL — AB (ref 70–99)
Potassium: 3.4 mEq/L — ABNORMAL LOW (ref 3.7–5.3)
Sodium: 137 mEq/L (ref 137–147)
Total Protein: 7.6 g/dL (ref 6.0–8.3)

## 2013-08-04 LAB — PROTIME-INR
INR: 0.95 (ref 0.00–1.49)
Prothrombin Time: 12.5 seconds (ref 11.6–15.2)

## 2013-08-04 LAB — ETHANOL: ALCOHOL ETHYL (B): 14 mg/dL — AB (ref 0–11)

## 2013-08-04 MED ORDER — ONDANSETRON HCL 4 MG PO TABS
4.0000 mg | ORAL_TABLET | Freq: Four times a day (QID) | ORAL | Status: DC | PRN
  Administered 2013-08-08: 4 mg via ORAL
  Filled 2013-08-04: qty 1

## 2013-08-04 MED ORDER — SODIUM CHLORIDE 0.9 % IV BOLUS (SEPSIS)
125.0000 mL | Freq: Once | INTRAVENOUS | Status: AC
  Administered 2013-08-04: 1000 mL via INTRAVENOUS

## 2013-08-04 MED ORDER — ONDANSETRON HCL 4 MG/2ML IJ SOLN
4.0000 mg | Freq: Four times a day (QID) | INTRAMUSCULAR | Status: DC | PRN
  Administered 2013-08-04: 4 mg via INTRAVENOUS
  Filled 2013-08-04: qty 2

## 2013-08-04 MED ORDER — FENTANYL CITRATE 0.05 MG/ML IJ SOLN
50.0000 ug | Freq: Once | INTRAMUSCULAR | Status: AC
  Administered 2013-08-04: 50 ug via INTRAVENOUS

## 2013-08-04 MED ORDER — MORPHINE SULFATE 2 MG/ML IJ SOLN
2.0000 mg | INTRAMUSCULAR | Status: DC | PRN
  Administered 2013-08-04 – 2013-08-05 (×4): 2 mg via INTRAVENOUS
  Administered 2013-08-05: 1 mg via INTRAVENOUS
  Filled 2013-08-04 (×5): qty 1

## 2013-08-04 MED ORDER — FENTANYL CITRATE 0.05 MG/ML IJ SOLN
50.0000 ug | Freq: Once | INTRAMUSCULAR | Status: AC
  Administered 2013-08-04: 50 ug via INTRAVENOUS
  Filled 2013-08-04: qty 2

## 2013-08-04 MED ORDER — SODIUM CHLORIDE 0.9 % IV BOLUS (SEPSIS)
500.0000 mL | Freq: Once | INTRAVENOUS | Status: AC
  Administered 2013-08-04: 500 mL via INTRAVENOUS

## 2013-08-04 MED ORDER — OXYCODONE HCL 5 MG PO TABS
5.0000 mg | ORAL_TABLET | ORAL | Status: DC | PRN
  Administered 2013-08-04 – 2013-08-06 (×7): 10 mg via ORAL
  Filled 2013-08-04 (×7): qty 2

## 2013-08-04 MED ORDER — IOHEXOL 300 MG/ML  SOLN
100.0000 mL | Freq: Once | INTRAMUSCULAR | Status: AC | PRN
  Administered 2013-08-04: 100 mL via INTRAVENOUS

## 2013-08-04 NOTE — ED Notes (Signed)
Family here

## 2013-08-04 NOTE — ED Notes (Signed)
The pt c/o pain in her chest head and her lt leg
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
## 2013-08-04 NOTE — Progress Notes (Signed)
Orthopedic Tech Progress Note Patient Details:  Tammy Santiago Apr 28, 1950 [id number]  Ortho Devices Type of Ortho Device: CAM walker Ortho Device/Splint Location: LLE Ortho Device/Splint Interventions: Ordered;Application   Jennye Moccasin 08/04/2013, 10:54 PM
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
## 2013-08-04 NOTE — ED Notes (Signed)
Xray at the bedside.
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
## 2013-08-04 NOTE — ED Notes (Signed)
Pt reports her pain to left buttock is better. 9/10

## 2013-08-04 NOTE — ED Notes (Signed)
The pt is still saying that she cannot empty her bladder

## 2013-08-04 NOTE — ED Notes (Signed)
Report called to [id number] rn

## 2013-08-04 NOTE — ED Notes (Signed)
The pt continues to c/o chest head and lt leg pain.  Ortho tech here to place a cam walker

## 2013-08-04 NOTE — ED Notes (Signed)
Pt voided in bedpan. States she still has to pee.
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
## 2013-08-04 NOTE — ED Notes (Signed)
Pt taken to ct scan.
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
## 2013-08-05 ENCOUNTER — Encounter (HOSPITAL_COMMUNITY): Payer: Self-pay | Admitting: General Practice

## 2013-08-05 ENCOUNTER — Inpatient Hospital Stay (HOSPITAL_COMMUNITY): Payer: BC Managed Care – PPO

## 2013-08-05 DIAGNOSIS — D62 Acute posthemorrhagic anemia: Secondary | ICD-10-CM

## 2013-08-05 LAB — CBC
HCT: 29.1 % — ABNORMAL LOW (ref 36.0–46.0)
HEMATOCRIT: 28.3 % — AB (ref 36.0–46.0)
HEMOGLOBIN: 9.6 g/dL — AB (ref 12.0–15.0)
HEMOGLOBIN: 9.7 g/dL — AB (ref 12.0–15.0)
MCH: 28.7 pg (ref 26.0–34.0)
MCH: 29 pg (ref 26.0–34.0)
MCHC: 33.3 g/dL (ref 30.0–36.0)
MCHC: 33.9 g/dL (ref 30.0–36.0)
MCV: 85.5 fL (ref 78.0–100.0)
MCV: 86.1 fL (ref 78.0–100.0)
Platelets: 216 10*3/uL (ref 150–400)
Platelets: 196 10*3/uL (ref 150–400)
RBC: 3.31 MIL/uL — ABNORMAL LOW (ref 3.87–5.11)
RBC: 3.38 MIL/uL — AB (ref 3.87–5.11)
RDW: 13.5 % (ref 11.5–15.5)
RDW: 13.7 % (ref 11.5–15.5)
WBC: 9.7 10*3/uL (ref 4.0–10.5)
WBC: 8.4 10*3/uL (ref 4.0–10.5)

## 2013-08-05 LAB — BASIC METABOLIC PANEL
BUN: 16 mg/dL (ref 6–23)
CHLORIDE: 102 meq/L (ref 96–112)
CO2: 23 mEq/L (ref 19–32)
Calcium: 8.5 mg/dL (ref 8.4–10.5)
Creatinine, Ser: 0.69 mg/dL (ref 0.50–1.10)
GFR calc Af Amer: >90 mL/min (ref >90)
GFR calc non Af Amer: >90 mL/min (ref >90)
GLUCOSE: 113 mg/dL — AB (ref 70–99)
Potassium: 3.5 mEq/L — ABNORMAL LOW (ref 3.7–5.3)
Sodium: 138 mEq/L (ref 137–147)

## 2013-08-05 LAB — MRSA PCR SCREENING: MRSA by PCR: NEGATIVE

## 2013-08-05 MED ORDER — BETHANECHOL CHLORIDE 25 MG PO TABS
25.0000 mg | ORAL_TABLET | Freq: Three times a day (TID) | ORAL | Status: DC
  Administered 2013-08-05 – 2013-08-08 (×10): 25 mg via ORAL
  Filled 2013-08-05 (×16): qty 1

## 2013-08-05 MED ORDER — POTASSIUM CHLORIDE CRYS ER 20 MEQ PO TBCR
20.0000 meq | EXTENDED_RELEASE_TABLET | Freq: Once | ORAL | Status: AC
  Administered 2013-08-05: 20 meq via ORAL
  Filled 2013-08-05: qty 1

## 2013-08-05 MED ORDER — BIOTENE DRY MOUTH MT LIQD: 15.0000 mL | Freq: Two times a day (BID) | OROMUCOSAL | Status: DC

## 2013-08-05 MED ORDER — PANTOPRAZOLE SODIUM 40 MG IV SOLR
40.0000 mg | Freq: Every day | INTRAVENOUS | Status: DC
  Filled 2013-08-05: qty 40

## 2013-08-05 MED ORDER — SODIUM CHLORIDE 0.9 % IV SOLN
INTRAVENOUS | Status: DC
  Administered 2013-08-05: 22:00:00 via INTRAVENOUS

## 2013-08-05 MED ORDER — PANTOPRAZOLE SODIUM 40 MG PO TBEC
40.0000 mg | DELAYED_RELEASE_TABLET | Freq: Every day | ORAL | Status: DC
  Administered 2013-08-05 – 2013-08-10 (×6): 40 mg via ORAL
  Filled 2013-08-05 (×7): qty 1

## 2013-08-05 MED ORDER — CHLORHEXIDINE GLUCONATE 0.12 % MT SOLN
15.0000 mL | Freq: Two times a day (BID) | OROMUCOSAL | Status: DC
  Administered 2013-08-05 – 2013-08-06 (×2): 15 mL via OROMUCOSAL
  Filled 2013-08-05 (×5): qty 15

## 2013-08-05 MED ORDER — ACETAMINOPHEN 325 MG PO TABS
650.0000 mg | ORAL_TABLET | ORAL | Status: DC | PRN
  Administered 2013-08-08 – 2013-08-10 (×4): 650 mg via ORAL
  Filled 2013-08-05 (×4): qty 2
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
## 2013-08-05 NOTE — Progress Notes (Signed)
UR completed.  Alexis Mizuno, RN BSN [facility] CCM Trauma/Neuro ICU Case Manager [phone]  

## 2013-08-05 NOTE — Progress Notes (Addendum)
Report called to RN on 5N. No s/s of acute distress noted. Will transfer pt per MD orders.  Pt will go to Xray first then go to 5N from there. RN on 5N made aware. Will notify pt's son of transfer.
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
## 2013-08-06 DIAGNOSIS — S20219A Contusion of unspecified front wall of thorax, initial encounter: Secondary | ICD-10-CM

## 2013-08-06 DIAGNOSIS — D62 Acute posthemorrhagic anemia: Secondary | ICD-10-CM | POA: Diagnosis not present

## 2013-08-06 DIAGNOSIS — R339 Retention of urine, unspecified: Secondary | ICD-10-CM

## 2013-08-06 DIAGNOSIS — E876 Hypokalemia: Secondary | ICD-10-CM

## 2013-08-06 DIAGNOSIS — S161XXA Strain of muscle, fascia and tendon at neck level, initial encounter: Secondary | ICD-10-CM | POA: Diagnosis present

## 2013-08-06 DIAGNOSIS — S82402A Unspecified fracture of shaft of left fibula, initial encounter for closed fracture: Secondary | ICD-10-CM | POA: Diagnosis present

## 2013-08-06 DIAGNOSIS — S329XXA Fracture of unspecified parts of lumbosacral spine and pelvis, initial encounter for closed fracture: Secondary | ICD-10-CM

## 2013-08-06 HISTORY — DX: Hypokalemia: E87.6

## 2013-08-06 HISTORY — DX: Retention of urine, unspecified: R33.9

## 2013-08-06 HISTORY — DX: Contusion of unspecified front wall of thorax, initial encounter: S20.219A

## 2013-08-06 LAB — CBC
HCT: 25.5 % — ABNORMAL LOW (ref 36.0–46.0)
HCT: 25.6 % — ABNORMAL LOW (ref 36.0–46.0)
Hemoglobin: 8.2 g/dL — ABNORMAL LOW (ref 12.0–15.0)
Hemoglobin: 8.5 g/dL — ABNORMAL LOW (ref 12.0–15.0)
MCH: 28.2 pg (ref 26.0–34.0)
MCH: 28.7 pg (ref 26.0–34.0)
MCHC: 32.2 g/dL (ref 30.0–36.0)
MCHC: 33.2 g/dL (ref 30.0–36.0)
MCV: 87.6 fL (ref 78.0–100.0)
MCV: 86.5 fL (ref 78.0–100.0)
Platelets: 171 10*3/uL (ref 150–400)
Platelets: 172 10*3/uL (ref 150–400)
RBC: 2.91 MIL/uL — ABNORMAL LOW (ref 3.87–5.11)
RBC: 2.96 MIL/uL — ABNORMAL LOW (ref 3.87–5.11)
RDW: 13.6 % (ref 11.5–15.5)
RDW: 13.5 % (ref 11.5–15.5)
WBC: 7.8 10*3/uL (ref 4.0–10.5)
WBC: 8 10*3/uL (ref 4.0–10.5)

## 2013-08-06 LAB — BASIC METABOLIC PANEL
BUN: 9 mg/dL (ref 6–23)
CHLORIDE: 103 meq/L (ref 96–112)
CO2: 24 meq/L (ref 19–32)
CREATININE: 0.64 mg/dL (ref 0.50–1.10)
Calcium: 8.1 mg/dL — ABNORMAL LOW (ref 8.4–10.5)
GFR calc non Af Amer: >90 mL/min (ref >90)
GLUCOSE: 94 mg/dL (ref 70–99)
POTASSIUM: 3.4 meq/L — AB (ref 3.7–5.3)
Sodium: 137 mEq/L (ref 137–147)

## 2013-08-06 MED ORDER — POTASSIUM CHLORIDE CRYS ER 20 MEQ PO TBCR
20.0000 meq | EXTENDED_RELEASE_TABLET | Freq: Two times a day (BID) | ORAL | Status: DC
  Administered 2013-08-06 – 2013-08-07 (×4): 20 meq via ORAL
  Filled 2013-08-06 (×6): qty 1

## 2013-08-06 MED ORDER — OXYCODONE HCL 5 MG PO TABS
5.0000 mg | ORAL_TABLET | ORAL | Status: DC | PRN
  Administered 2013-08-06 – 2013-08-07 (×3): 10 mg via ORAL
  Filled 2013-08-06 (×3): qty 2

## 2013-08-06 MED ORDER — POLYETHYLENE GLYCOL 3350 17 G PO PACK
17.0000 g | PACK | Freq: Every day | ORAL | Status: DC
  Administered 2013-08-06 – 2013-08-08 (×3): 17 g via ORAL
  Filled 2013-08-06 (×5): qty 1

## 2013-08-06 MED ORDER — MORPHINE SULFATE 2 MG/ML IJ SOLN
2.0000 mg | INTRAMUSCULAR | Status: DC | PRN
  Administered 2013-08-09: 2 mg via INTRAVENOUS
  Filled 2013-08-06: qty 1

## 2013-08-06 MED ORDER — DOCUSATE SODIUM 100 MG PO CAPS
100.0000 mg | ORAL_CAPSULE | Freq: Two times a day (BID) | ORAL | Status: DC
  Administered 2013-08-06 – 2013-08-10 (×7): 100 mg via ORAL
  Filled 2013-08-06 (×10): qty 1

## 2013-08-06 MED ORDER — TRAMADOL HCL 50 MG PO TABS
100.0000 mg | ORAL_TABLET | Freq: Four times a day (QID) | ORAL | Status: DC
  Administered 2013-08-06 – 2013-08-07 (×7): 100 mg via ORAL
  Filled 2013-08-06 (×8): qty 2
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
## 2013-08-06 NOTE — Progress Notes (Signed)
Rehab Admissions Coordinator Note:  Patient was screened by Andrey Farmer Lekeith Wulf for appropriateness for an Inpatient Acute Rehab Consult.  At this time, we are recommending Inpatient Rehab consult.  Shiva Sahagian L Lafe Clerk, PT 08/06/2013, 1:01 PM  I can be reached at [phone].
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
## 2013-08-06 NOTE — Progress Notes (Signed)
Pt encouraged to continue to use incentive spirometer properly.  Current temp is 100.6 and BP = 99/54.  Following use of IS, respirations were 18-20 breaths per minute.  Will continue to monitior.
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
## 2013-08-07 LAB — BASIC METABOLIC PANEL
BUN: 11 mg/dL (ref 6–23)
CALCIUM: 8.8 mg/dL (ref 8.4–10.5)
CO2: 26 meq/L (ref 19–32)
CREATININE: 0.69 mg/dL (ref 0.50–1.10)
Chloride: 106 mEq/L (ref 96–112)
GFR calc Af Amer: >90 mL/min (ref >90)
Glucose, Bld: 106 mg/dL — ABNORMAL HIGH (ref 70–99)
Potassium: 4.2 mEq/L (ref 3.7–5.3)
Sodium: 141 mEq/L (ref 137–147)

## 2013-08-07 LAB — CBC
HCT: 27.3 % — ABNORMAL LOW (ref 36.0–46.0)
Hemoglobin: 9 g/dL — ABNORMAL LOW (ref 12.0–15.0)
MCH: 28.5 pg (ref 26.0–34.0)
MCHC: 33 g/dL (ref 30.0–36.0)
MCV: 86.4 fL (ref 78.0–100.0)
PLATELETS: 181 10*3/uL (ref 150–400)
RBC: 3.16 MIL/uL — AB (ref 3.87–5.11)
RDW: 13.5 % (ref 11.5–15.5)
WBC: 6.8 10*3/uL (ref 4.0–10.5)
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
## 2013-08-08 MED ORDER — HYDROCODONE-ACETAMINOPHEN 7.5-325 MG PO TABS
1.0000 | ORAL_TABLET | ORAL | Status: DC | PRN
  Administered 2013-08-08 (×2): 1 via ORAL
  Filled 2013-08-08 (×2): qty 1

## 2013-08-08 NOTE — Progress Notes (Signed)
Patient examined and I agree with the assessment and plan Hopefully she can work towards CIR. Violeta Gelinas, MD, MPH, FACS Trauma: [phone] General Surgery: [phone]  08/08/2013 2:11 PM
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
## 2013-08-09 LAB — CBC
HCT: 27.2 % — ABNORMAL LOW (ref 36.0–46.0)
Hemoglobin: 8.9 g/dL — ABNORMAL LOW (ref 12.0–15.0)
MCH: 28.6 pg (ref 26.0–34.0)
MCHC: 32.7 g/dL (ref 30.0–36.0)
MCV: 87.5 fL (ref 78.0–100.0)
PLATELETS: 217 10*3/uL (ref 150–400)
RBC: 3.11 MIL/uL — ABNORMAL LOW (ref 3.87–5.11)
RDW: 13.6 % (ref 11.5–15.5)
WBC: 5.8 10*3/uL (ref 4.0–10.5)

## 2013-08-09 LAB — BASIC METABOLIC PANEL
BUN: 12 mg/dL (ref 6–23)
CHLORIDE: 106 meq/L (ref 96–112)
CO2: 28 mEq/L (ref 19–32)
CREATININE: 0.6 mg/dL (ref 0.50–1.10)
Calcium: 8.7 mg/dL (ref 8.4–10.5)
Glucose, Bld: 94 mg/dL (ref 70–99)
POTASSIUM: 4.2 meq/L (ref 3.7–5.3)
Sodium: 143 mEq/L (ref 137–147)

## 2013-08-09 LAB — URINALYSIS, ROUTINE W REFLEX MICROSCOPIC
Bilirubin Urine: NEGATIVE
Glucose, UA: NEGATIVE mg/dL
KETONES UR: 15 mg/dL — AB
LEUKOCYTES UA: NEGATIVE
NITRITE: NEGATIVE
Protein, ur: NEGATIVE mg/dL
SPECIFIC GRAVITY, URINE: 1.011 (ref 1.005–1.030)
Urobilinogen, UA: 1 mg/dL (ref 0.0–1.0)
pH: 7.5 (ref 5.0–8.0)

## 2013-08-09 LAB — URINE MICROSCOPIC-ADD ON

## 2013-08-09 MED ORDER — BETHANECHOL CHLORIDE 10 MG PO TABS
10.0000 mg | ORAL_TABLET | Freq: Four times a day (QID) | ORAL | Status: DC
  Administered 2013-08-09: 10 mg via ORAL
  Filled 2013-08-09 (×5): qty 1

## 2013-08-09 MED ORDER — HYDROCODONE-ACETAMINOPHEN 10-325 MG PO TABS
0.5000 | ORAL_TABLET | ORAL | Status: DC | PRN
  Administered 2013-08-09 – 2013-08-10 (×2): 2 via ORAL
  Filled 2013-08-09 (×2): qty 2

## 2013-08-09 MED ORDER — ENOXAPARIN SODIUM 40 MG/0.4ML ~~LOC~~ SOLN
40.0000 mg | SUBCUTANEOUS | Status: DC
  Administered 2013-08-09 – 2013-08-10 (×2): 40 mg via SUBCUTANEOUS
  Filled 2013-08-09 (×3): qty 0.4

## 2013-08-09 NOTE — Progress Notes (Signed)
Agree with SPT.    Shaquandra Galano, PT [phone]  
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
## 2013-08-09 NOTE — Clinical Social Work Note (Signed)
Clinical Social Worker continuing to follow patient and family for support and discharge planning needs.  CSW spoke with patient son over the phone and patient at bedside who remain agreeable with inpatient rehab for primary discharge disposition, however are agreeable to SNF placement at [facility] if insurance issues a denial.  Patient and patient son confirm that patient will have adequate support and assistance once discharged.  CSW remains available for support and to assist with discharge planning needs, pending insurance authorization.  Macario Golds, [place] [phone]

## 2013-08-09 NOTE — Progress Notes (Signed)
UA is negative.  BMs improving.  This patient has been seen and I agree with the findings and treatment plan.  Marta Lamas. Gae Bon, MD, FACS [phone] (pager) [phone] (direct pager) Trauma Surgeon
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
## 2013-08-10 ENCOUNTER — Inpatient Hospital Stay (HOSPITAL_COMMUNITY)
Admission: RE | Admit: 2013-08-10 | Discharge: 2013-08-17 | DRG: 945 | Disposition: A | Discharge status: Home Health | Payer: BC Managed Care – PPO | Source: Intra-hospital | Attending: Physical Medicine & Rehabilitation | Admitting: Physical Medicine & Rehabilitation

## 2013-08-10 ENCOUNTER — Inpatient Hospital Stay (HOSPITAL_COMMUNITY): Payer: BC Managed Care – PPO

## 2013-08-10 ENCOUNTER — Encounter (HOSPITAL_COMMUNITY): Payer: Self-pay | Admitting: *Deleted

## 2013-08-10 DIAGNOSIS — F411 Generalized anxiety disorder: Secondary | ICD-10-CM | POA: Diagnosis present

## 2013-08-10 DIAGNOSIS — S32409A Unspecified fracture of unspecified acetabulum, initial encounter for closed fracture: Secondary | ICD-10-CM | POA: Diagnosis present

## 2013-08-10 DIAGNOSIS — F3289 Other specified depressive episodes: Secondary | ICD-10-CM | POA: Diagnosis present

## 2013-08-10 DIAGNOSIS — J189 Pneumonia, unspecified organism: Secondary | ICD-10-CM | POA: Diagnosis present

## 2013-08-10 DIAGNOSIS — S82409A Unspecified fracture of shaft of unspecified fibula, initial encounter for closed fracture: Secondary | ICD-10-CM | POA: Diagnosis present

## 2013-08-10 DIAGNOSIS — Y92009 Unspecified place in unspecified non-institutional (private) residence as the place of occurrence of the external cause: Secondary | ICD-10-CM

## 2013-08-10 DIAGNOSIS — T1490XA Injury, unspecified, initial encounter: Secondary | ICD-10-CM

## 2013-08-10 DIAGNOSIS — S329XXA Fracture of unspecified parts of lumbosacral spine and pelvis, initial encounter for closed fracture: Secondary | ICD-10-CM

## 2013-08-10 DIAGNOSIS — M47812 Spondylosis without myelopathy or radiculopathy, cervical region: Secondary | ICD-10-CM

## 2013-08-10 DIAGNOSIS — Z8261 Family history of arthritis: Secondary | ICD-10-CM

## 2013-08-10 DIAGNOSIS — Z5189 Encounter for other specified aftercare: Principal | ICD-10-CM

## 2013-08-10 DIAGNOSIS — D62 Acute posthemorrhagic anemia: Secondary | ICD-10-CM | POA: Diagnosis present

## 2013-08-10 DIAGNOSIS — Z87891 Personal history of nicotine dependence: Secondary | ICD-10-CM

## 2013-08-10 DIAGNOSIS — S82402A Unspecified fracture of shaft of left fibula, initial encounter for closed fracture: Secondary | ICD-10-CM | POA: Diagnosis present

## 2013-08-10 DIAGNOSIS — Z8249 Family history of ischemic heart disease and other diseases of the circulatory system: Secondary | ICD-10-CM

## 2013-08-10 DIAGNOSIS — Z602 Problems related to living alone: Secondary | ICD-10-CM

## 2013-08-10 DIAGNOSIS — F329 Major depressive disorder, single episode, unspecified: Secondary | ICD-10-CM | POA: Diagnosis present

## 2013-08-10 DIAGNOSIS — S20219A Contusion of unspecified front wall of thorax, initial encounter: Secondary | ICD-10-CM | POA: Diagnosis present

## 2013-08-10 DIAGNOSIS — M5030 Other cervical disc degeneration, unspecified cervical region: Secondary | ICD-10-CM | POA: Diagnosis present

## 2013-08-10 DIAGNOSIS — Z818 Family history of other mental and behavioral disorders: Secondary | ICD-10-CM

## 2013-08-10 DIAGNOSIS — S161XXA Strain of muscle, fascia and tendon at neck level, initial encounter: Secondary | ICD-10-CM | POA: Diagnosis present

## 2013-08-10 DIAGNOSIS — Z888 Allergy status to other drugs, medicaments and biological substances status: Secondary | ICD-10-CM

## 2013-08-10 DIAGNOSIS — S32509A Unspecified fracture of unspecified pubis, initial encounter for closed fracture: Secondary | ICD-10-CM | POA: Diagnosis present

## 2013-08-10 DIAGNOSIS — K5900 Constipation, unspecified: Secondary | ICD-10-CM | POA: Diagnosis present

## 2013-08-10 DIAGNOSIS — Z6379 Other stressful life events affecting family and household: Secondary | ICD-10-CM

## 2013-08-10 DIAGNOSIS — S060X9A Concussion with loss of consciousness of unspecified duration, initial encounter: Secondary | ICD-10-CM | POA: Diagnosis present

## 2013-08-10 DIAGNOSIS — S139XXA Sprain of joints and ligaments of unspecified parts of neck, initial encounter: Secondary | ICD-10-CM | POA: Diagnosis present

## 2013-08-10 DIAGNOSIS — G4700 Insomnia, unspecified: Secondary | ICD-10-CM | POA: Diagnosis present

## 2013-08-10 DIAGNOSIS — S40019A Contusion of unspecified shoulder, initial encounter: Secondary | ICD-10-CM | POA: Diagnosis present

## 2013-08-10 DIAGNOSIS — IMO0002 Reserved for concepts with insufficient information to code with codable children: Secondary | ICD-10-CM | POA: Diagnosis present

## 2013-08-10 DIAGNOSIS — Z9089 Acquired absence of other organs: Secondary | ICD-10-CM

## 2013-08-10 DIAGNOSIS — Z79899 Other long term (current) drug therapy: Secondary | ICD-10-CM

## 2013-08-10 DIAGNOSIS — Z833 Family history of diabetes mellitus: Secondary | ICD-10-CM

## 2013-08-10 DIAGNOSIS — I10 Essential (primary) hypertension: Secondary | ICD-10-CM | POA: Diagnosis present

## 2013-08-10 HISTORY — DX: Injury, unspecified, initial encounter: T14.90XA

## 2013-08-10 LAB — URINALYSIS, ROUTINE W REFLEX MICROSCOPIC
Bilirubin Urine: NEGATIVE
Glucose, UA: NEGATIVE mg/dL
Hgb urine dipstick: NEGATIVE
Ketones, ur: 15 mg/dL — AB
LEUKOCYTES UA: NEGATIVE
Nitrite: NEGATIVE
PROTEIN: 100 mg/dL — AB
SPECIFIC GRAVITY, URINE: 1.023 (ref 1.005–1.030)
Urobilinogen, UA: 1 mg/dL (ref 0.0–1.0)
pH: 8 (ref 5.0–8.0)

## 2013-08-10 LAB — CBC
HCT: 31 % — ABNORMAL LOW (ref 36.0–46.0)
HEMOGLOBIN: 10.3 g/dL — AB (ref 12.0–15.0)
MCH: 28.5 pg (ref 26.0–34.0)
MCHC: 33.2 g/dL (ref 30.0–36.0)
MCV: 85.9 fL (ref 78.0–100.0)
Platelets: 321 10*3/uL (ref 150–400)
RBC: 3.61 MIL/uL — AB (ref 3.87–5.11)
RDW: 13.7 % (ref 11.5–15.5)
WBC: 11 10*3/uL — ABNORMAL HIGH (ref 4.0–10.5)

## 2013-08-10 LAB — URINE MICROSCOPIC-ADD ON

## 2013-08-10 MED ORDER — FERROUS FUMARATE 325 (106 FE) MG PO TABS
1.0000 | ORAL_TABLET | Freq: Every day | ORAL | Status: DC
  Administered 2013-08-11 – 2013-08-16 (×6): 106 mg via ORAL
  Filled 2013-08-10 (×7): qty 1

## 2013-08-10 MED ORDER — METHOCARBAMOL 500 MG PO TABS: 500.0000 mg | ORAL_TABLET | Freq: Four times a day (QID) | ORAL | Status: DC | PRN

## 2013-08-10 MED ORDER — ONDANSETRON HCL 4 MG/2ML IJ SOLN: 4.0000 mg | Freq: Four times a day (QID) | INTRAMUSCULAR | Status: DC | PRN

## 2013-08-10 MED ORDER — GUAIFENESIN-DM 100-10 MG/5ML PO SYRP: 5.0000 mL | ORAL_SOLUTION | Freq: Four times a day (QID) | ORAL | Status: DC | PRN

## 2013-08-10 MED ORDER — HYDROCODONE-ACETAMINOPHEN 5-325 MG PO TABS
1.0000 | ORAL_TABLET | ORAL | Status: DC | PRN
  Administered 2013-08-10 – 2013-08-11 (×2): 2 via ORAL
  Administered 2013-08-11 – 2013-08-17 (×14): 1 via ORAL
  Filled 2013-08-10 (×12): qty 1
  Filled 2013-08-10 (×3): qty 2
  Filled 2013-08-10: qty 1

## 2013-08-10 MED ORDER — TRAZODONE HCL 50 MG PO TABS: 25.0000 mg | ORAL_TABLET | Freq: Every evening | ORAL | Status: DC | PRN

## 2013-08-10 MED ORDER — BISACODYL 10 MG RE SUPP: 10.0000 mg | Freq: Every day | RECTAL | Status: DC | PRN

## 2013-08-10 MED ORDER — ALUM & MAG HYDROXIDE-SIMETH 200-200-20 MG/5ML PO SUSP
30.0000 mL | ORAL | Status: DC | PRN
  Administered 2013-08-14 (×2): 30 mL via ORAL
  Filled 2013-08-10 (×2): qty 30

## 2013-08-10 MED ORDER — PANTOPRAZOLE SODIUM 40 MG PO TBEC
40.0000 mg | DELAYED_RELEASE_TABLET | Freq: Every day | ORAL | Status: DC
  Administered 2013-08-11 – 2013-08-17 (×7): 40 mg via ORAL
  Filled 2013-08-10 (×9): qty 1

## 2013-08-10 MED ORDER — SENNOSIDES-DOCUSATE SODIUM 8.6-50 MG PO TABS
1.0000 | ORAL_TABLET | Freq: Every day | ORAL | Status: DC
  Administered 2013-08-10: 1 via ORAL
  Filled 2013-08-10 (×2): qty 1

## 2013-08-10 MED ORDER — FLEET ENEMA 7-19 GM/118ML RE ENEM: 1.0000 | ENEMA | Freq: Once | RECTAL | Status: AC | PRN

## 2013-08-10 MED ORDER — CITALOPRAM HYDROBROMIDE 40 MG PO TABS
40.0000 mg | ORAL_TABLET | Freq: Every day | ORAL | Status: DC
  Administered 2013-08-10 – 2013-08-17 (×8): 40 mg via ORAL
  Filled 2013-08-10 (×9): qty 1

## 2013-08-10 MED ORDER — ENOXAPARIN SODIUM 40 MG/0.4ML ~~LOC~~ SOLN
40.0000 mg | SUBCUTANEOUS | Status: DC
Start: 2013-08-10 — End: 2013-08-17
  Administered 2013-08-10 – 2013-08-16 (×7): 40 mg via SUBCUTANEOUS
  Filled 2013-08-10 (×8): qty 0.4

## 2013-08-10 MED ORDER — DIPHENHYDRAMINE HCL 12.5 MG/5ML PO ELIX: 12.5000 mg | ORAL_SOLUTION | Freq: Four times a day (QID) | ORAL | Status: DC | PRN

## 2013-08-10 MED ORDER — SENNOSIDES-DOCUSATE SODIUM 8.6-50 MG PO TABS: 1.0000 | ORAL_TABLET | Freq: Every evening | ORAL | Status: DC | PRN

## 2013-08-10 MED ORDER — ACETAMINOPHEN 325 MG PO TABS
650.0000 mg | ORAL_TABLET | ORAL | Status: DC | PRN
  Administered 2013-08-11 – 2013-08-15 (×6): 650 mg via ORAL
  Filled 2013-08-10 (×6): qty 2

## 2013-08-10 MED ORDER — ONDANSETRON HCL 4 MG PO TABS: 4.0000 mg | ORAL_TABLET | Freq: Four times a day (QID) | ORAL | Status: DC | PRN

## 2013-08-10 MED ORDER — LEVOFLOXACIN 750 MG PO TABS
750.0000 mg | ORAL_TABLET | Freq: Every day | ORAL | Status: DC
  Administered 2013-08-10 – 2013-08-17 (×8): 750 mg via ORAL
  Filled 2013-08-10 (×9): qty 1

## 2013-08-10 MED ORDER — DICLOFENAC SODIUM 1 % TD GEL
2.0000 g | Freq: Four times a day (QID) | TRANSDERMAL | Status: DC
  Administered 2013-08-10 – 2013-08-17 (×22): 2 g via TOPICAL
  Filled 2013-08-10 (×2): qty 100

## 2013-08-10 MED ORDER — HYDROXYZINE HCL 10 MG PO TABS
10.0000 mg | ORAL_TABLET | Freq: Three times a day (TID) | ORAL | Status: DC | PRN
  Filled 2013-08-10: qty 1

## 2013-08-10 MED ORDER — IPRATROPIUM-ALBUTEROL 0.5-2.5 (3) MG/3ML IN SOLN: 3.0000 mL | RESPIRATORY_TRACT | Status: DC | PRN
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
## 2013-08-10 NOTE — Progress Notes (Signed)
Rehab admissions - I have approval for acute inpatient rehab admission for today.  Bed available and will admit to inpatient rehab today.  Patient is agreeable.  Call me for questions.  #[phone]
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
## 2013-08-10 NOTE — Clinical Social Work Note (Signed)
Clinical Social Worker continuing to follow patient and family for support and discharge planning needs.  Patient has been accepted to inpatient rehab and insurance approval was received.  Patient agreeable to transition today and bed available.    Clinical Social Worker will sign off for now as social work intervention is no longer needed. Please consult [place] again if new need arises.  Macario Golds, [place] [phone]

## 2013-08-10 NOTE — Progress Notes (Signed)
Rehab admissions - Evaluated for possible admission.  I opened the case and faxed information yesterday to [employer].  Awaiting response from insurance carrier.  Call me for questions.  #[phone]
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
## 2013-08-10 NOTE — Progress Notes (Signed)
Agree with SPT.    Ameia Morency, PT [phone]  

## 2013-08-10 NOTE — Interval H&P Note (Signed)
Tammy Santiago was admitted today to Inpatient Rehabilitation with the diagnosis of pelvic fx's, left fibular fx.  The patient's history has been reviewed, patient examined, and there is no change in status.  Patient continues to be appropriate for intensive inpatient rehabilitation.  I have reviewed the patient's chart and labs.  Questions were answered to the patient's satisfaction.  Ranelle Oyster 08/10/2013, 9:35 PM
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
## 2013-08-10 NOTE — Discharge Summary (Signed)
Roarke Marciano, MD, MPH, FACS Trauma: [phone] General Surgery: [phone]  

## 2013-08-10 NOTE — Progress Notes (Signed)
Tammy Santiago to be D/C'd Rehab per MD order. Discussed with the patient and all questions fully answered.    Medication List    ASK your doctor about these medications       multivitamin with minerals Tabs tablet  Take 1 tablet by mouth daily.     VITAMIN E COMPLETE PO  Take 1 capsule by mouth daily.        VVS, Skin clean, dry and intact without evidence of skin break down, no evidence of skin tears noted.  Patient escorted via WC, and D/C home via private auto.  Kai Levins  08/10/2013 6:56 PM
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
## 2013-08-10 NOTE — Progress Notes (Signed)
Improving Anticipate CIR Patient examined and I agree with the assessment and plan  Violeta Gelinas, MD, MPH, FACS Trauma: [phone] General Surgery: [phone]  08/10/2013 11:35 AM

## 2013-08-11 ENCOUNTER — Inpatient Hospital Stay (HOSPITAL_COMMUNITY): Payer: BC Managed Care – PPO

## 2013-08-11 ENCOUNTER — Inpatient Hospital Stay (HOSPITAL_COMMUNITY): Payer: BC Managed Care – PPO | Admitting: Occupational Therapy

## 2013-08-11 LAB — CBC WITH DIFFERENTIAL/PLATELET
BASOS PCT: 0 % (ref 0–1)
Basophils Absolute: 0 10*3/uL (ref 0.0–0.1)
Eosinophils Absolute: 0.1 10*3/uL (ref 0.0–0.7)
Eosinophils Relative: 1 % (ref 0–5)
HEMATOCRIT: 27.1 % — AB (ref 36.0–46.0)
Hemoglobin: 9 g/dL — ABNORMAL LOW (ref 12.0–15.0)
Lymphocytes Relative: 18 % (ref 12–46)
Lymphs Abs: 1.7 10*3/uL (ref 0.7–4.0)
MCH: 28.4 pg (ref 26.0–34.0)
MCHC: 33.2 g/dL (ref 30.0–36.0)
MCV: 85.5 fL (ref 78.0–100.0)
MONO ABS: 0.7 10*3/uL (ref 0.1–1.0)
Monocytes Relative: 7 % (ref 3–12)
NEUTROS ABS: 7.1 10*3/uL (ref 1.7–7.7)
Neutrophils Relative %: 74 % (ref 43–77)
Platelets: 309 10*3/uL (ref 150–400)
RBC: 3.17 MIL/uL — ABNORMAL LOW (ref 3.87–5.11)
RDW: 14 % (ref 11.5–15.5)
WBC: 9.6 10*3/uL (ref 4.0–10.5)

## 2013-08-11 LAB — COMPREHENSIVE METABOLIC PANEL
ALT: 12 U/L (ref 0–35)
AST: 16 U/L (ref 0–37)
Albumin: 2.4 g/dL — ABNORMAL LOW (ref 3.5–5.2)
Alkaline Phosphatase: 49 U/L (ref 39–117)
BILIRUBIN TOTAL: 0.9 mg/dL (ref 0.3–1.2)
BUN: 15 mg/dL (ref 6–23)
CALCIUM: 9.3 mg/dL (ref 8.4–10.5)
CHLORIDE: 103 meq/L (ref 96–112)
CO2: 26 mEq/L (ref 19–32)
CREATININE: 0.6 mg/dL (ref 0.50–1.10)
GFR calc Af Amer: >90 mL/min (ref >90)
GFR calc non Af Amer: >90 mL/min (ref >90)
Glucose, Bld: 97 mg/dL (ref 70–99)
Potassium: 4.2 mEq/L (ref 3.7–5.3)
Sodium: 141 mEq/L (ref 137–147)
TOTAL PROTEIN: 6.2 g/dL (ref 6.0–8.3)

## 2013-08-11 MED ORDER — BACITRACIN-NEOMYCIN-POLYMYXIN OINTMENT TUBE
TOPICAL_OINTMENT | Freq: Every day | CUTANEOUS | Status: DC
  Administered 2013-08-11 – 2013-08-12 (×2): via TOPICAL
  Administered 2013-08-13: 1 via TOPICAL
  Administered 2013-08-14 – 2013-08-15 (×2): via TOPICAL
  Filled 2013-08-11: qty 15

## 2013-08-11 MED ORDER — SENNOSIDES-DOCUSATE SODIUM 8.6-50 MG PO TABS
2.0000 | ORAL_TABLET | Freq: Two times a day (BID) | ORAL | Status: DC
  Administered 2013-08-11 – 2013-08-17 (×12): 2 via ORAL
  Filled 2013-08-11 (×15): qty 2

## 2013-08-11 NOTE — Progress Notes (Addendum)
Physical Therapy Note  Patient Details  Name: Tammy Santiago MRN: [id number] Date of Birth: Feb 03, 1951 Today's Date: 08/11/2013 1445-1525, 40 minPain rated 5/10 L hip; premedicated Tx focused on strengthening, transfers and gait.  Using RW, basic transfers with min assist, mod VCs for w/c prep and safe hand placement during sit>< stand.  Up/down 5 steps with 2 rails, with min assist for ascending;  mod assist for descending, wearing CAM boot.  Gait with RW x 15' x 2 with min assist.  Strengthening bil hip abductors using orange Theraband, in sitting, and knee ext in sitting x 10 reps each.  Requested clarification of Marissa Nestle, PA regarding CAM boot use in PT for gentle AROM L ankle. She will follow up.  Returned to room in care of NT for toileting.  Susa Loffler 08/11/2013, 3:40 PM
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
## 2013-08-11 NOTE — Care Management Note (Signed)
[facility] Individual Statement of Services  Patient Name:  Tammy Santiago  Date:  08/11/2013  Welcome to the [facility].  Our goal is to provide you with an individualized program based on your diagnosis and situation, designed to meet your specific needs.  With this comprehensive rehabilitation program, you will be expected to participate in at least 3 hours of rehabilitation therapies Monday-Friday, with modified therapy programming on the weekends.  Your rehabilitation program will include the following services:  Physical Therapy (PT), Occupational Therapy (OT), 24 hour per day rehabilitation nursing, Neuropsychology, Case Management (Social Worker), Rehabilitation Medicine, Nutrition Services and Pharmacy Services  Weekly team conferences will be held on Wednesday to discuss your progress.  Your Social Worker will talk with you frequently to get your input and to update you on team discussions.  Team conferences with you and your family in attendance may also be held.  Expected length of stay: 7-9 days Overall anticipated outcome: supervision/mod/i level  Depending on your progress and recovery, your program may change. Your Social Worker will coordinate services and will keep you informed of any changes. Your Social Worker's name and contact numbers are listed  below.  The following services may also be recommended but are not provided by the [facility]:   Driving Evaluations  Home Health Rehabiltiation Services  Outpatient Rehabilitation Services   Arrangements will be made to provide these services after discharge if needed.  Arrangements include referral to agencies that provide these services.  Your insurance has been verified to be:  BCBS Your primary doctor is:  Pharmacist, community doesn't know the name  Pertinent information will be shared with your doctor and your insurance company.  Social Worker:  Dossie Der, [phone] or [phone]  Information discussed with and copy given to patient by: Lucy Chris, 08/11/2013, 12:50 PM
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
## 2013-08-11 NOTE — Progress Notes (Signed)
Patient information reviewed and entered into eRehab system by Perlita Forbush, RN, CRRN, PPS Coordinator.  Information including medical coding and functional independence measure will be reviewed and updated through discharge.    
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
## 2013-08-12 ENCOUNTER — Inpatient Hospital Stay (HOSPITAL_COMMUNITY): Payer: BC Managed Care – PPO | Admitting: Physical Therapy

## 2013-08-12 ENCOUNTER — Inpatient Hospital Stay (HOSPITAL_COMMUNITY): Payer: BC Managed Care – PPO

## 2013-08-12 ENCOUNTER — Inpatient Hospital Stay (HOSPITAL_COMMUNITY): Payer: BC Managed Care – PPO | Admitting: Occupational Therapy

## 2013-08-12 ENCOUNTER — Inpatient Hospital Stay (HOSPITAL_COMMUNITY): Payer: BC Managed Care – PPO | Admitting: *Deleted

## 2013-08-12 DIAGNOSIS — M47812 Spondylosis without myelopathy or radiculopathy, cervical region: Secondary | ICD-10-CM

## 2013-08-12 DIAGNOSIS — S329XXA Fracture of unspecified parts of lumbosacral spine and pelvis, initial encounter for closed fracture: Secondary | ICD-10-CM

## 2013-08-12 DIAGNOSIS — S82409A Unspecified fracture of shaft of unspecified fibula, initial encounter for closed fracture: Secondary | ICD-10-CM

## 2013-08-12 LAB — URINE CULTURE
COLONY COUNT: NO GROWTH
Culture: NO GROWTH

## 2013-08-12 NOTE — Progress Notes (Signed)
Physical Therapy Session Note  Patient Details  Name: Tammy Santiago MRN: [id number] Date of Birth: June 20, 1950  Today's Date: 08/12/2013 Time: 5093-2671 Time Calculation (min): 45 min  Short Term Goals: Week 1:  PT Short Term Goal 1 (Week 1): = LTGs due to LOS  Skilled Therapeutic Interventions/Progress Updates:   Pt resting in w/c; reporting 3/10 pain at rest and requesting to wait until end of session for pain medication.  Educated pt on pre-medicating to improve activity and pain tolerance.  Pt requesting to use toilet.  Performed stand pivot transfer w/c <> toilet with grab bars and min A with mod verbal cues for safe sequence during pivoting.  Pt able to perform all toileting tasks supervision and performed hand hygiene at sink with supervision.  Performed w/c mobility x 100' in controlled environment with supervision for UE strengthening and endurance.  Performed stair negotiation review, sequencing and problem solving for home entry. Cued pt to verbalize entire sequence to therapist as if she were cuing her son.  Discussed having pt's son place RW at top of deck prior to pt exiting the car (which son will pull right up to back deck stairs) and then returning to assist.  Pt verbalized and demonstrated safe stair negotiation up/down 4 stairs with 2 rails and supervision with step to sequence.  Pt also educated on through demonstration how to perform w/c <> car transfer stand pivot with RW and importance of sitting on seat first before placing LE into car.  Pt required min A to enter/exit simulated car with max verbal cues to recall sequence previously demonstrated.  Will need to also educate son on stair negotiation and car transfer sequences.  Returned to w/c and to room and assisted pt with doffing clothes and donning night gown for the evening with min A in standing with RW.  Pt returned to w/c with Kpad placed at lower back/buttocks and RN notified for pain medication secondary to pain  increasing to 5/10.    Therapy Documentation Precautions:  Precautions Precautions: Fall Required Braces or Orthoses: Other Brace/Splint Other Brace/Splint: cam walker left foot (to be worn at all times, cover with plastic during bathing if using tub bench) Restrictions Weight Bearing Restrictions: Yes LLE Weight Bearing: Weight bearing as tolerated Vital Signs: Therapy Vitals Temp: 98 F (36.7 C) Temp src: Oral Pulse Rate: 86 Resp: 17 BP: 91/58 mmHg (RN notified) Patient Position (if appropriate): Sitting Oxygen Therapy SpO2: 98 % O2 Device: None (Room air) Pain: Pain Assessment Pain Assessment: 0-10 Pain Score: 5  Pain Type: Acute pain Pain Location: Buttocks Pain Orientation: Left Pain Descriptors / Indicators: Aching;Sore Pain Onset: With Activity Pain Intervention(s): RN made aware;Heat applied Locomotion : Ambulation Ambulation/Gait Assistance: 5: Supervision Wheelchair Mobility Distance: 100   See FIM for current functional status  Therapy/Group: Individual Therapy  Helenwood Desanctis 08/12/2013, 4:00 PM

## 2013-08-12 NOTE — Progress Notes (Signed)
Occupational Therapy Session Note  Patient Details  Name: Tammy Santiago MRN: [id number] Date of Birth: Aug 18, 1950  Today's Date: 08/12/2013 Time: 1105-1200 and 1300-1330 Time Calculation (min): 55 min and 30 min  Short Term Goals: No short term goals set  Skilled Therapeutic Interventions/Progress Updates:    Visit 1: Pain: No c/o pain Tx:   Pt seen for BADL retraining of bathing and dressing with a focus on activity tolerance and standing balance and transfers to the tub bench. Pt used the tub room as the tub wall is on the R which is the same set up for her at home. Pt used Rw to step to tub bench and placed R foot inside tub with assist to position L foot on stool outside of tub.  A plastic bag applied over boot. Pt was able to bathe and used lateral lean to the R to wash her bottom. Pt was having more difficulty today reaching to her feet to don underwear over her feet. She needed assist with that task but used a reacher to don her pants with cues.  She was able to don R sock. Pt provided with a reacher and a reacher walker bag. Pt returned to her room for lunch.  Visit 2: Pain:no c/o pain Tx: Pt seen this session to address bed mobility skills. Pt expressed that she is having great difficulty with getting in and out of bed without pain. Attempted sidelying on L hip but too painful for patient.  Pt provided with a leg lifter. Pt practiced in and out of bed 2x with leg lifter with close supervision. Pt ambulated to toilet with RW and then back to her w/c to rest before her PT session. Pt tolerated session well.    Therapy Documentation Precautions:  Precautions Precautions: Fall Required Braces or Orthoses: Other Brace/Splint Other Brace/Splint: cam walker left foot (to be worn at all times, cover with plastic during bathing if using tub bench) Restrictions Weight Bearing Restrictions: Yes LLE Weight Bearing: Weight bearing as tolerated       ADL: ADL ADL Comments: Refer to  FIM  See FIM for current functional status  Therapy/Group: Individual Therapy  Stark Bray Treshon Stannard 08/12/2013, 12:15 PM

## 2013-08-12 NOTE — Progress Notes (Signed)
Recreational Therapy Session Note  Patient Details  Name: Tammy Santiago MRN: [id number] Date of Birth: [birth year] Today's Date: 08/12/2013  Pain: no c/o Skilled Therapeutic Interventions/Progress Updates: Order received & chart reviewed.  Met with pt and discussed importance of remaining active & engaged in activities of choice post discharge as she continues to recover.  Pt states she has little involvement in community pursuits PTA due to anxiety.  Reviewed potential strategies to use to assist with anxiety management.  Pt stated understanding.  No formal TR treatment plan implemented as pt is scheduled for discharge 6/9.  Therapy/Group: Individual Therapy  Waldon Reining 08/12/2013, 3:50 PM
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
## 2013-08-12 NOTE — Progress Notes (Addendum)
Physical Therapy Session Note  Patient Details  Name: Tammy Santiago MRN: [id number] Date of Birth: 1950/07/27  Today's Date: 08/12/2013 Time: [phone], 60 min  Time Calculation (min): 60 min  Short Term Goals: Week 1:  PT Short Term Goal 1 (Week 1): = LTGs due to LOS  Skilled Therapeutic Interventions/Progress Updates:  Therapeutic exercise performed with LE to increase strength for functional mobility; seated 10 x 1 each- R hip flexion, R knee ext with 1#, R ankle pumps, L knee ext, bil hip abd against mild resistance.  Gait x 5' with youth RW with mod assist initially, fading to min assist.  Mod VCs for sequence, especially during divided attention task of talking and ambulating.  Standing tolerance x 10 minutes during bil UE activity. Pain increased to 6/10 L hip by end of tx.  W/c propulsion using bil UEs x 80' with supervision.  W/c parts management with verbal cues for brakes and R footplate management.  K pad applied to L hip, and RN informed of pain level.  Pt left in room with all needs in place.    Therapy Documentation Precautions:  Precautions Precautions: Fall Required Braces or Orthoses: Other Brace/Splint Other Brace/Splint: cam walker left foot (to be worn at all times, cover with plastic during bathing if using tub bench) Restrictions Weight Bearing Restrictions: Yes LLE Weight Bearing: Weight bearing as tolerated Pain: Pain Assessment Pain Assessment: 0-10 Pain Score: 6  Pain Type: Acute pain Pain Location: Hip Pain Orientation: Left Pain Descriptors / Indicators: Aching Pain Frequency: Intermittent Pain Onset: With Activity Patients Stated Pain Goal: 2 Pain Intervention(s): Medication (See eMAR)   See FIM for current functional status  Therapy/Group: Individual Therapy  Susa Loffler 08/12/2013, 12:32 PM

## 2013-08-13 ENCOUNTER — Inpatient Hospital Stay (HOSPITAL_COMMUNITY): Payer: BC Managed Care – PPO | Admitting: Occupational Therapy

## 2013-08-13 ENCOUNTER — Inpatient Hospital Stay (HOSPITAL_COMMUNITY): Payer: BC Managed Care – PPO | Admitting: Physical Therapy

## 2013-08-13 ENCOUNTER — Inpatient Hospital Stay (HOSPITAL_COMMUNITY): Payer: BC Managed Care – PPO | Admitting: *Deleted

## 2013-08-13 LAB — CBC
HEMATOCRIT: 32 % — AB (ref 36.0–46.0)
HEMOGLOBIN: 10 g/dL — AB (ref 12.0–15.0)
MCH: 27.7 pg (ref 26.0–34.0)
MCHC: 31.3 g/dL (ref 30.0–36.0)
MCV: 88.6 fL (ref 78.0–100.0)
Platelets: 493 10*3/uL — ABNORMAL HIGH (ref 150–400)
RBC: 3.61 MIL/uL — ABNORMAL LOW (ref 3.87–5.11)
RDW: 14.3 % (ref 11.5–15.5)
WBC: 7.9 10*3/uL (ref 4.0–10.5)

## 2013-08-13 NOTE — Progress Notes (Signed)
Physical Therapy Session Note  Patient Details  Name: Tammy Santiago MRN: [id number] Date of Birth: [birth year]  Today's Date: 08/13/2013 Time: 2774-1287 Time Calculation (min): 31 min  Short Term Goals: Week 1:  PT Short Term Goal 1 (Week 1): = LTGs due to LOS  Skilled Therapeutic Interventions/Progress Updates:    Pt received seated in w/c in hallway accompanied by OT. Pt agreeable to therapy. Session focused on functional transfers, LLE strengthening, and dynamic standing balance. See below for detailed description of therapeutic exercises. Multiple sit<>stand transfers from w/c (for seated rest breaks between exercises) with min guard-min A. Performed w/c mobility x150' in controlled environment with bilat UE's requiring min A, increased time. Session ended in pt room, where pt was left seated in w/c with all needs within reach.  Therapy Documentation Precautions:  Precautions Precautions: Fall Required Braces or Orthoses: Other Brace/Splint Other Brace/Splint: cam walker left foot (to be worn at all times, cover with plastic during bathing if using tub bench) Restrictions Weight Bearing Restrictions: Yes LLE Weight Bearing: Weight bearing as tolerated Pain: Pain Assessment Pain Assessment: 0-10 Pain Score: 4  Pain Type: Acute pain Pain Location: Buttocks Pain Orientation: Left Pain Descriptors / Indicators: Aching Pain Frequency: Intermittent Pain Onset: Other (Comment) (Transition from sit>supine) Patients Stated Pain Goal: 2 Pain Intervention(s): Repositioned;Distraction Multiple Pain Sites: No Locomotion : Wheelchair Mobility Distance: 150  Therapeutic Exercises:  Therapist guided pt through the following standing exercises (to pt fatigue) with resistance of L CAM boot;  bilat UE support at mat table and close supervision for dynamic standing balance: L knee flexion x10 reps, L hip flexion x13 reps, L hip abduction x8 reps, bilat sidestepping x8 reps per side, L hip  extension x12 reps. Cueing focused on technique, body mechanics to strengthen specific muscle groups. Pt required seated rest breaks x4 total.  See FIM for current functional status  Therapy/Group: Individual Therapy  Lorenda Ishihara Elesia Pemberton 08/13/2013, 4:14 PM

## 2013-08-13 NOTE — Progress Notes (Signed)
Physical Therapy Session Note  Patient Details  Name: Tammy Santiago MRN: [id number] Date of Birth: 11/18/1950  Today's Date: 08/13/2013 Time:  8:02-9:00 ( ) and 14:25-14:55 ( )   Short Term Goals: Week 1:  PT Short Term Goal 1 (Week 1): = LTGs due to LOS  Skilled Therapeutic Interventions/Progress Updates:  First tx focused on functional mobility with RW, gait training, stairs, and therex for strength and activity tolerance.  Pt needing to use restroom upon arrival. Performed mobility in room with RW and min A for lifting/steadying as well as verbal cues for hand placement and safety during turns. Pt donned R shoe for improved symmetry in standing/gait.   Performed gait training in hall 2x110' with RW and close S. Pt initially needing verbal and tacile cues for posture, with step-to pattern, but pt progressed to self-correcting and improved step length.   Instructed pt in seated therex with 1# on RLE for LAQ and marching, 2x10 in pain-free range for strengthening. Performed Nustep at level 2 x2.32min with bil UE and LE, but pt wanting to stop due to pain/fatigue.   Instructed pt in stairs x5 with 2 rails and step to pattern, min A overall, esp during turn at end. Pt needing cues for sequence x1.   Pt left up in [facility] with K-pad and all needs in reach.   Second tx focused on apartment mobilty, gait with RW, and stairs. Pt reporting 0/10 pain at rest, but wanting to take pain meds before tx.   Gait in controlled setting 1x120' and 2x50' and 1x75' with RW and S in controlled and apartment settings with cues for posture and turning technique in varying conditions. Instructed pt in side-stepping technique with RW for navigating home situations in kitchen.  Pt performed furniture transfers with S/min A depending on height.  Instructed pt in stairs and curb step with Min A overall for steadying and cues/demo for sequence. Pt was able to return-demo safety, but fatigued by end of tx.  Left  up in [facility] with all needs in reach.      Therapy Documentation Precautions:  Precautions Precautions: Fall Required Braces or Orthoses: Other Brace/Splint Other Brace/Splint: cam walker left foot (to be worn at all times, cover with plastic during bathing if using tub bench) Restrictions Weight Bearing Restrictions: Yes LLE Weight Bearing: Weight bearing as tolerated \  Vital Signs: Therapy Vitals Pulse Rate: 85 Oxygen Therapy SpO2: 98 % Pain: Pain Assessment Pain Assessment: 0-10 Pain Score: 4  Pain Type: Acute pain Pain Location: Hip Pain Orientation: Left - nursing aware and brought pain meds, helped pt on K-pad end of tx.    Locomotion : Ambulation Ambulation/Gait Assistance: 5: Supervision   See FIM for current functional status  Therapy/Group: Individual Therapy Clydene Laming, PT, DPT   08/13/2013, 8:40 AM
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
## 2013-08-13 NOTE — Progress Notes (Signed)
Occupational Therapy Session Note  Patient Details  Name: Tammy Santiago MRN: [id number] Date of Birth: October 07, 1950  Today's Date: 08/13/2013 Time: 1308-6578 Time Calculation (min): 26 min  Skilled Therapeutic Interventions/Progress Updates:    Pt used the RW to ambulate in the room to gather clothing for laundry task.  Pt needing mod questioning cues for hand placement with sit to stand as pt attempts to pull up on the walker.  She also walks with a slight flexed trunk and the walker too far in front of her.  Pt used wheelchair after gathering clothing to roll down to the laundry room as she reported being too fatigued to walk.  She was able to stand with the RW and place clothing in the washer with min assist.  Therapist discussed different ways pt could transport clothing to and from the laundry room via use of walker bag, rolling chair, cart, or hanging them on the walker.    Therapy Documentation Precautions:  Precautions Precautions: Fall Required Braces or Orthoses: Other Brace/Splint Other Brace/Splint: cam walker left foot (to be worn at all times, cover with plastic during bathing if using tub bench) Restrictions Weight Bearing Restrictions: No LLE Weight Bearing: Weight bearing as tolerated  Pain: Pain Assessment Pain Assessment: Faces Pain Score: 4  Faces Pain Scale: Hurts little more Pain Type: Acute pain Pain Location: Buttocks Pain Orientation: Left Pain Descriptors / Indicators: Aching Pain Frequency: Intermittent Pain Onset: Other (Comment) (Transition from sit>supine) Patients Stated Pain Goal: 2 Pain Intervention(s): Repositioned;Emotional support Multiple Pain Sites: No ADL: See FIM for current functional status  Therapy/Group: Individual Therapy  Delon Sacramento OTR/L 08/13/2013, 4:16 PM

## 2013-08-13 NOTE — Progress Notes (Signed)
Occupational Therapy Session Note  Patient Details  Name: Tammy Santiago MRN: [id number] Date of Birth: September 10, 1950  Today's Date: 08/13/2013 Time: 8466-5993 Time Calculation (min): 45 min  Short Term Goals: No short term goals set  Skilled Therapeutic Interventions/Progress Updates:      Pt seen for BADL retraining of bathing and dressing with a focus on use of AE, safe functional mobility, and dynamic balance.  Pt used tub bench in tub room with min a to steady her balance as she scooted on bench and A to stabilize L leg. Pt propped L leg on shower seat outside of tub.  Pt completed all of her self care with supervision. She place R foot on stood to increase ease with donning sock and shoe.  Pt had no LOB with standing activities. Pt returned to her room at end of session with call light in reach.  Therapy Documentation Precautions:  Precautions Precautions: Fall Required Braces or Orthoses: Other Brace/Splint Other Brace/Splint: cam walker left foot (to be worn at all times, cover with plastic during bathing if using tub bench) Restrictions Weight Bearing Restrictions: Yes LLE Weight Bearing: Weight bearing as tolerated Therapy Vitals Pulse Rate: 85 Oxygen Therapy SpO2: 98 % Pain: Pain Assessment Pain Assessment: 0-10 Pain Score: 2  Pain Type: Acute pain Pain Location: Hip Pain Orientation: Left Pain Descriptors / Indicators: Aching Pain Frequency: Intermittent Pain Onset: Gradual Patients Stated Pain Goal: 2 Pain Intervention(s): Medication (See eMAR) ADL: ADL ADL Comments: Refer to FIM  See FIM for current functional status  Therapy/Group: Individual Therapy  Earle Gell 08/13/2013, 12:25 PM
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
## 2013-08-14 ENCOUNTER — Inpatient Hospital Stay (HOSPITAL_COMMUNITY): Payer: BC Managed Care – PPO

## 2013-08-14 DIAGNOSIS — S46819A Strain of other muscles, fascia and tendons at shoulder and upper arm level, unspecified arm, initial encounter: Secondary | ICD-10-CM

## 2013-08-14 DIAGNOSIS — R269 Unspecified abnormalities of gait and mobility: Secondary | ICD-10-CM

## 2013-08-14 DIAGNOSIS — B351 Tinea unguium: Secondary | ICD-10-CM

## 2013-08-14 DIAGNOSIS — R300 Dysuria: Secondary | ICD-10-CM

## 2013-08-14 DIAGNOSIS — R209 Unspecified disturbances of skin sensation: Secondary | ICD-10-CM

## 2013-08-14 DIAGNOSIS — F3289 Other specified depressive episodes: Secondary | ICD-10-CM

## 2013-08-14 DIAGNOSIS — Z0000 Encounter for general adult medical examination without abnormal findings: Secondary | ICD-10-CM

## 2013-08-14 DIAGNOSIS — Z23 Encounter for immunization: Secondary | ICD-10-CM

## 2013-08-14 DIAGNOSIS — S329XXA Fracture of unspecified parts of lumbosacral spine and pelvis, initial encounter for closed fracture: Secondary | ICD-10-CM

## 2013-08-14 DIAGNOSIS — F329 Major depressive disorder, single episode, unspecified: Secondary | ICD-10-CM

## 2013-08-14 DIAGNOSIS — IMO0002 Reserved for concepts with insufficient information to code with codable children: Secondary | ICD-10-CM

## 2013-08-14 DIAGNOSIS — M25559 Pain in unspecified hip: Secondary | ICD-10-CM

## 2013-08-14 DIAGNOSIS — M79609 Pain in unspecified limb: Secondary | ICD-10-CM

## 2013-08-14 DIAGNOSIS — E785 Hyperlipidemia, unspecified: Secondary | ICD-10-CM

## 2013-08-14 DIAGNOSIS — N318 Other neuromuscular dysfunction of bladder: Secondary | ICD-10-CM

## 2013-08-14 DIAGNOSIS — I10 Essential (primary) hypertension: Secondary | ICD-10-CM

## 2013-08-14 DIAGNOSIS — S43499A Other sprain of unspecified shoulder joint, initial encounter: Secondary | ICD-10-CM

## 2013-08-14 DIAGNOSIS — E559 Vitamin D deficiency, unspecified: Secondary | ICD-10-CM

## 2013-08-14 NOTE — Progress Notes (Signed)
Physical Therapy Session Note  Patient Details  Name: Tammy Santiago MRN: [id number] Date of Birth: 10/28/1950  Today's Date: 08/14/2013 Time: 0931-1028 Time Calculation (min): 57 min  Short Term Goals: Week 1:  PT Short Term Goal 1 (Week 1): = LTGs due to LOS  Skilled Therapeutic Interventions/Progress Updates:  Therapeutic Activity: Toileting min A for steady. Multiple stand pivot transfers sit<>stand, variable height surfaces (w/c, recliner, toilet seat, mat table) with min A for safety and frequent verbal cues for squencing and safety. Pt. Demonstrates ability to sit<>stand from mat table without UE support min A for balance/safety.  Therapeutic Exercise: seated hip flexion 2x10 each, knee extension 2x10 each, mini squats at mat table for support 3x10. Pt. Requires min A for safety and maintaining balance.  Gait: 100' x 2 with RW supervision. Pt. Given cues to increase step height and length, and incorporated them into ambulation technique. Pt. Utilizes step-through gait pattern with RW and BUE support to maintain WBAT status.  Pt. States ambulation and activity eliminates pelvic pain.   Therapy Documentation Precautions:  Precautions Precautions: Fall Required Braces or Orthoses: Other Brace/Splint Other Brace/Splint: cam walker left foot (to be worn at all times, cover with plastic during bathing if using tub bench) Restrictions Weight Bearing Restrictions: Yes LLE Weight Bearing: Weight bearing as tolerated    Vital Signs: Pulse Rate: 98 SpO2: 95 % O2 Device: None (Room air)  Pain: Pain Assessment Pain Assessment: 0-10 Pain Score: 2  (2/10 Sitting in recliner) Pain Type: Acute pain Pain Location: Pelvis Pain Orientation: Posterior Pain Descriptors / Indicators: Aching;Nagging Pain Frequency: Intermittent Pain Onset: Gradual Patients Stated Pain Goal: 0 Pain Intervention(s): Repositioned;Distraction;Other (Comment) (Pain diminshed due to  activity/walking) Mobility:   Locomotion : Ambulation Ambulation/Gait Assistance: 5: Supervision   See FIM for current functional status  Therapy/Group: Individual Therapy  Raiford Noble 08/14/2013, 10:44 AM

## 2013-08-14 NOTE — Progress Notes (Signed)
Physical Therapy Session Note  Patient Details  Name: Tammy Santiago MRN: [id number] Date of Birth: 15-Sep-1950  Today's Date: 08/14/2013 Time: 5859-2924 Time Calculation (min): 46 min  Short Term Goals: Week 1:  PT Short Term Goal 1 (Week 1): = LTGs due to LOS  Skilled Therapeutic Interventions/Progress Updates:  Therapeutic Activities: stairs 4 steps x 4 with two rails. Transfers sit<>stand x 12 without UE support x 10 at supervision. Pt. Requires frequent reassurance that BLE will support her for standing, transfers, and related activities.  Gait: Ambulated with RW min guard - Min A 25' x 6 with verbal cues for posture and direction changes. Pt. Tolerated increased perturbations on irregular surfaces, direction changes, and obstacle avoidance min guard - min A for safety. Pt. Verbalized fear of falling, and demonstrated unease with new challenges.  Pt. Had no c/o of pain pre or post tx. Pt. Requested frequent cups of water during tx session.  Therapy Documentation Precautions:  Precautions Precautions: Fall Required Braces or Orthoses: Other Brace/Splint Other Brace/Splint: cam walker left foot (to be worn at all times, cover with plastic during bathing if using tub bench) Restrictions Weight Bearing Restrictions: Yes LLE Weight Bearing: Weight bearing as tolerated   Pain: Pain Assessment Pain Assessment: No/denies pain Pain Score: 0-No pain  See FIM for current functional status  Therapy/Group: Individual Therapy  Raiford Noble 08/14/2013, 3:48 PM

## 2013-08-14 NOTE — Progress Notes (Signed)
Occupational Therapy Session Note  Patient Details  Name: Tammy Santiago MRN: [id number] Date of Birth: 1950/05/21  Today's Date: 08/14/2013 Time: 0800-0845 Time Calculation (min): 45 min  Short Term Goals: Week 1:  OT Short Term Goal 1 (Week 1): STGs = LTGs due to short LOS  Skilled Therapeutic Interventions/Progress Updates: ADL-retraining with focus on functional mobility, transfers, dynamic standing balance, and adapted bathing/dressing skills.   Patient received seated in w/c, completing self-feeding, and reporting decreased pain with use of improved technique during bed mobility and transfers.  Patient completed bathing at tub room after ambulating from w/c to tub bench (10') using RW with steadying assist during transfers.  Patient completed dressing at recliner, donning underwear and pants w/o need for reacher this session and stood to pull up pants with only SBA for safety.    Therapy Documentation Precautions:  Precautions Precautions: Fall Required Braces or Orthoses: Other Brace/Splint Other Brace/Splint: cam walker left foot (to be worn at all times, cover with plastic during bathing if using tub bench) Restrictions Weight Bearing Restrictions: Yes LLE Weight Bearing: Weight bearing as tolerated  Vital Signs: Therapy Vitals Temp: 98.6 F (37 C) Temp src: Oral Pulse Rate: 89 Resp: 18 BP: 131/85 mmHg Patient Position (if appropriate): Sitting Oxygen Therapy SpO2: 97 % O2 Device: None (Room air)  Pain: Pain Assessment Pain Assessment: No/denies pain  ADL: ADL ADL Comments: Refer to FIM  See FIM for current functional status  Therapy/Group: Individual Therapy  Second session: Time: 1330-1415 Time Calculation (min): 45 min  Pain Assessment: No/denies pain  Skilled Therapeutic Interventions: ADL-retraining (15 min) with focus on toileting (transfers, clothing mgmt and hygiene).   Patient received in her bathroom, completing toileting.   Patient stated she  had only urinated but during SBA assist OT noted feces and advised patient to complete hygiene.   Patient attempted hygiene but required mod assist to complete due to inadequate thoroughness.  Patient managed clothing while standing at RW and then ambulated to sink for hand hygiene with only SBA.  Patient was escorted to gym via w/c but challenged to participate in dynamic standing activity, playing Goban using vertical large game board.   After initial instructions, pt sustained min support at RW while standing and reaching forward in all quadrants during game play for 20 minutes before requesting seated rest break.   Patient was returned to room in w/c with no adverse effects noted.  See FIM for current functional status  Therapy/Group: Individual Therapy  Donzetta Kohut 08/14/2013, 9:03 AM
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
## 2013-08-15 ENCOUNTER — Inpatient Hospital Stay (HOSPITAL_COMMUNITY): Payer: BC Managed Care – PPO | Admitting: *Deleted

## 2013-08-15 NOTE — Progress Notes (Signed)
Occupational Therapy Session Note  Patient Details  Name: Tammy Santiago MRN: [id number] Date of Birth: [birth year]  Today's Date: 08/15/2013 Time:  - 1030-1115  (45 min).1st session    Short Term Goals: Week 1:  OT Short Term Goal 1 (Week 1): STGs = LTGs due to short LOS  Skilled Therapeutic Interventions/Progress Updates:    1st session:  ADL-retraining with focus on functional mobility, transfers, dynamic standing balance, and adapted bathing/dressing skills. Patient transferred from toilet to wc with SBA plus grab bars.  Propelled wc with min assist to gather supplies for shower.    Propelled wc to EXTRA ADL BR.  Pt transferred>tub bench>wc with minimal assisting position LLE on seat.  Prepared LLE with plastic for shower.  Pt. Dressed in wc with no AE usage.  Taken back to room for next therapy.    Therapy Documentation Precautions:  Precautions Precautions: Fall Required Braces or Orthoses: Other Brace/Splint Other Brace/Splint: cam walker left foot (to be worn at all times, cover with plastic during bathing if using tub bench) Restrictions Weight Bearing Restrictions: Yes LLE Weight Bearing: Weight bearing as tolerated       Pain:  4/10 LLE   ADL: ADL ADL Comments: Refer to FIM    Other Treatments:    2nd session:  Time:1300-1400  (60 min) Pain:  Left hip/pelvic pain 4/10.  Relieved when not in standing.   Individual session  Engaged in functional mobility with homemaking duties.  Pt. Ambulated with RW to Washer dryer and performed laundry with supervision to for machine operation>  Pt. Ambulated in day room and watered plants with increasing hip pain in standing.  Pain relieved when sitting.  Taken back to room and left in wc ready for next session.    See FIM for current functional status  Therapy/Group: Individual Therapy  Humberto Seals 08/15/2013, 10:32 AM

## 2013-08-15 NOTE — Progress Notes (Signed)
Physical Therapy Session Note  Patient Details  Name: Tammy Santiago MRN: [id number] Date of Birth: [birth year]  Today's Date: 08/15/2013 Time: 5146-0479 Time Calculation (min): 55 min  Short Term Goals: Week 1:  PT Short Term Goal 1 (Week 1): = LTGs due to LOS  Skilled Therapeutic Interventions/Progress Updates:  Pt performed multiple sit to stand transfers with S and rolling walker. Pt ambulated about 150 feet x 2 with rolling walker and S. Pt performed LE exercises with 1 lb on R and no weight on L to increase LE strength and flexibility. Performed 3 sets x 10 reps mini squats for LE strengthening.   Therapy Documentation Precautions:  Precautions Precautions: Fall Required Braces or Orthoses: Other Brace/Splint Other Brace/Splint: cam walker left foot (to be worn at all times, cover with plastic during bathing if using tub bench) Restrictions Weight Bearing Restrictions: Yes LLE Weight Bearing: Weight bearing as tolerated General:   Pain: No c/o pain at rest.    Locomotion : Ambulation Ambulation/Gait Assistance: 5: Supervision   See FIM for current functional status  Therapy/Group: Individual Therapy  Rayford Halsted 08/15/2013, 12:41 PM

## 2013-08-15 NOTE — Progress Notes (Signed)
Occupational Therapy Session Note  Patient Details  Name: Tammy Santiago MRN: [id number] Date of Birth: 1950-12-31  Today's Date: 08/15/2013 Time: 0034-9179 Time Calculation (min): 45 min  Short Term Goals: Week 1:  OT Short Term Goal 1 (Week 1): STGs = LTGs due to short LOS  Skilled Therapeutic Interventions/Progress Updates:    Pt engaged in functional amb with RW to address increased independence with home mgt tasks, activity tolerance, safety awareness, and dynamic standing balance.  Pt propelled to ADL apartment and requested to use toilet before making up the bed.  Pt amb with RW into bathroom and completed all toileting tasks at supervision level.  Pt engaged in making bed up requiring multiple rest breaks throughout session.  Pt exhibited LOB X 1 during task and required min A for correction.  Pt propelled back to room and transferred to bed requiring assistance bringing LLE into bed.    Therapy Documentation Precautions:  Precautions Precautions: Fall Required Braces or Orthoses: Other Brace/Splint Other Brace/Splint: cam walker left foot (to be worn at all times, cover with plastic during bathing if using tub bench) Restrictions Weight Bearing Restrictions: Yes LLE Weight Bearing: Weight bearing as tolerated Pain: Pain Assessment Pain Assessment: 0-10 Pain Score: 4  Pain Type: Acute pain Pain Location: Hip Pain Orientation: Left Pain Descriptors / Indicators: Aching Pain Frequency: Intermittent Pain Onset: With Activity Patients Stated Pain Goal: 2 Pain Intervention(s): RN made aware;Repositioned  See FIM for current functional status  Therapy/Group: Individual Therapy  Rich Brave 08/15/2013, 3:35 PM
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
## 2013-08-16 ENCOUNTER — Ambulatory Visit: Payer: BC Managed Care – PPO | Admitting: Neurology

## 2013-08-16 ENCOUNTER — Encounter (HOSPITAL_COMMUNITY): Payer: BC Managed Care – PPO

## 2013-08-16 ENCOUNTER — Inpatient Hospital Stay (HOSPITAL_COMMUNITY): Payer: BC Managed Care – PPO | Admitting: Occupational Therapy

## 2013-08-16 ENCOUNTER — Inpatient Hospital Stay (HOSPITAL_COMMUNITY): Payer: BC Managed Care – PPO

## 2013-08-16 DIAGNOSIS — T1490XA Injury, unspecified, initial encounter: Secondary | ICD-10-CM

## 2013-08-16 NOTE — Progress Notes (Addendum)
Physical Therapy Discharge Summary  Patient Details  Name: Tammy Santiago MRN: [id number] Date of Birth: [birth year]  Today's Date: 08/16/2013  Patient has met 9 of 11 long term goals due to improved activity tolerance, improved balance, improved postural control, increased strength, decreased pain, ability to compensate for deficits and functional use of  right lower extremity and left lower extremity.  Patient to discharge at an ambulatory level Supervision.   Patient's care partner is available  to provide the necessary cognitive assistance at discharge; pt chatters and distracts herself when transferring.  Reasons goals not met: see above  Recommendation:  Patient will benefit from ongoing skilled PT services in home health setting to continue to advance safe functional mobility, address ongoing impairments in strength, pain, balance, functional mobility and locomotion, and minimize fall risk.  Equipment: youth RW  Reasons for discharge: treatment goals met and discharge from hospital  Patient/family agrees with progress made and goals achieved: Yes Family education performed day of DC, family verbalized understanding of home entry and access techniques, with no further questions at this time.   PT Discharge Precautions/Restrictions Precautions Precautions: Fall Required Braces or Orthoses: Other Brace/Splint Other Brace/Splint: cam walker left foot (to be worn at all times, cover with plastic during bathing if using tub bench) Restrictions Weight Bearing Restrictions: No LLE Weight Bearing: Weight bearing as tolerated Vital Signs Therapy Vitals Temp: 97.6 F (36.4 C) Temp src: Oral Pulse Rate: 80 Resp: 18 BP: 117/71 mmHg Patient Position (if appropriate): Sitting Oxygen Therapy SpO2: 98 % O2 Device: None (Room air) Pain Pain Assessment Pain Assessment: 0-10 Pain Score: 5  Faces Pain Scale: Hurts little more Pain Type: Acute pain Pain Location: Hip Pain  Orientation: Left Pain Descriptors / Indicators: Sore Pain Frequency: Intermittent Pain Onset: Gradual Pain Intervention(s): Medication (See eMAR) Multiple Pain Sites: No Vision/Perception - no changes    Cognition Overall Cognitive Status: Within Functional Limits for tasks assessed Orientation Level: Oriented X4 Attention: Selective (with divided attention during transfers and gait, pt loses focus) Sensation Sensation Light Touch: Appears Intact Stereognosis: Appears Intact Hot/Cold: Appears Intact Proprioception: Appears Intact Coordination Gross Motor Movements are Fluid and Coordinated: Yes Fine Motor Movements are Fluid and Coordinated: Yes Heel Shin Test: NT due to pain/fxs Motor  Motor Motor - Discharge Observations: generalized weakness, improved since admission  Mobility Bed Mobility Bed Mobility:  (modified independent) Transfers Transfers: Yes Stand Pivot Transfers: 5: Supervision Stand Pivot Transfer Details: Verbal cues for safe use of DME/AE Stand Pivot Transfer Details (indicate cue type and reason): when talking, pt loses focus and forgets where to put hands Locomotion  Ambulation Ambulation/Gait Assistance: 5: Supervision Assistive device: Rolling walker Ambulation/Gait Assistance Details: Verbal cues for technique Gait Gait Pattern: Decreased hip/knee flexion - left;Decreased stance time - left;Decreased stride length Gait velocity: 1.26'/second; decreased functional efficiency; household ambulationg High Level Ambulation High Level Ambulation: Backwards walking;Side stepping Side Stepping: with RW, supervision Backwards Walking: wtih Rw, supervision Stairs / Additional Locomotion Stairs: Yes Stairs Assistance: 5: Supervision Stairs Assistance Details: Verbal cues for technique Stair Management Technique: Two rails Number of Stairs: 5 Height of Stairs: 7 Ramp: 4: Min assist (RW) Curb: 4: Min assist (RW) Architect:  Yes Wheelchair Assistance: 5: Careers information officer: Both upper extremities Wheelchair Parts Management: Needs assistance Distance: 150  Trunk/Postural Assessment  Cervical Assessment Cervical Assessment: Within Water engineer Thoracic Assessment: Within Functional Limits Lumbar Assessment Lumbar Assessment: Within Functional Limits Postural Control Postural Control: Within Functional Limits  Balance Balance Balance Assessed: Yes Static Sitting Balance Static Sitting - Level of Assistance: 7: Independent Static Standing Balance Static Standing - Level of Assistance: 5: Stand by assistance Dynamic Standing Balance Dynamic Standing - Level of Assistance: 5: Stand by assistance Extremity Assessment      RLE Assessment RLE Assessment: Exceptions to [facility] RLE Strength RLE Overall Strength Comments: grossly in sitting, resistance limited due to fxs: 3/5 hip flex, 5/5 knee flex/ext and ankle DF LLE Assessment LLE Assessment: Exceptions to [facility] LLE Strength LLE Overall Strength Comments: grossly in sitting, resistance limited due to fxs: 3-/5 hip flex, at least 4-/5 knee flex, ext.  ankle DF NT  See FIM for current functional status  [place], PT, DPT  08/16/2013, 5:16 PM

## 2013-08-16 NOTE — Progress Notes (Signed)
Occupational Therapy Session Note  Patient Details  Name: Tammy Santiago MRN: [id number] Date of Birth: 05-12-1950  Today's Date: 08/16/2013 Time: 1000-1045 Time Calculation (min): 45 min  Short Term Goals: No short term goals set  Skilled Therapeutic Interventions/Progress Updates:    Pt seen for BADL retraining of bathing and dressing with a focus on use of AE, safe functional mobility, and dynamic balance. Pt used tub bench in tub room with min a to adjust L leg on stool as she scooted on bench. Pt propped L leg on stool outside of tub. Pt completed all of her self care with mod I. She used reacher one time to assist with donning pants over large left boot.  Pt had no LOB with standing activities. Pt returned to her room at end of session with call light in reach.   Therapy Documentation Precautions:  Precautions Precautions: Fall Required Braces or Orthoses: Other Brace/Splint Other Brace/Splint: cam walker left foot (to be worn at all times, cover with plastic during bathing if using tub bench) Restrictions Weight Bearing Restrictions: Yes LLE Weight Bearing: Weight bearing as tolerated   Pain: Pain Assessment Pain Assessment: No/denies pain  ADL: ADL ADL Comments: Refer to FIM  See FIM for current functional status  Therapy/Group: Individual Therapy  Earle Gell 08/16/2013, 11:53 AM
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
## 2013-08-16 NOTE — Progress Notes (Signed)
Social Work Patient ID: Tammy Santiago, female   DOB: Jun 11, 1950, 63 y.o.   MRN: [id number] Met with pt to discuss follow up needs.  She is agreeable to rolling walker, bsc and tub bench.  Aware tub bench is private pay. She is going to her son's home until she is able to return to her own.  She will have someone with her there. She feels much better about going home Now she came to rehab to get a head start on her progress.

## 2013-08-16 NOTE — Progress Notes (Signed)
Physical Therapy Session Note  Patient Details  Name: Tammy Santiago MRN: [id number] Date of Birth: 14-Jul-1950  Today's Date: 08/16/2013 Time:0800-0900,  [id number]   Time Calculation (min): 60 and 45 min  Short Term Goals: Week 1:  PT Short Term Goal 1 (Week 1): = LTGs due to LOS     Skilled Therapeutic Interventions/Progress Updates:   Tx 1:  Gait in home setting on tile and carpet, with supervision, improved fluidity since last week.  Occasional VCs for sequencing as pt distracts herself with talking.  Pt transported items by laying them over front of RW, safely.  Sit>< stand from 16" high surface, supervision.  VCs for hand placement due to self distractions as above.  Up/down 5 steps 2 rails, supervision.  1 cue needed for sequencing.  W/c propulsion x 150' using bil UEs for activity tolerance. Standing mini squats and sitting alternating hip flexion, 10 x 1 each.  Tx 2:  Gait training with RW up/down ramp, curb with min guard assist.  Simulated car transfer with supervision.   Min cues for technique. Advanced gait in home environment and controlled env requiring sidestepping and backwards walking, with supervision, cues for technique. Simulated car transfer with supervision.  Pt reported that her brother was planning to pick her up, but he has a very high-off-the ground SUV.  She plans to call her son to pick her up.    Therapy Documentation Precautions:  Precautions Precautions: Fall Required Braces or Orthoses: Other Brace/Splint Other Brace/Splint: cam walker left foot (to be worn at all times, cover with plastic during bathing if using tub bench) Restrictions Weight Bearing Restrictions: No LLE Weight Bearing: Weight bearing as tolerated Pain: Pain Assessment Pain Assessment: 0-10 Pain Score:4 Pain Type: Acute pain Pain Location: Hip Pain Orientation: Left Pain Intervention(s): Medication (See eMAR);Repositioned;Ambulation/increased activity;Emotional  support Multiple Pain Sites: No    See FIM for current functional status  Therapy/Group: Individual Therapy  Susa Loffler 08/16/2013, 5:01 PM

## 2013-08-16 NOTE — Progress Notes (Signed)
Occupational Therapy Session Note  Patient Details  Name: Tammy Santiago MRN: [id number] Date of Birth: [birth year]  Today's Date: 08/16/2013 Time: 2263-3354 Time Calculation (min): 50 min  Skilled Therapeutic Interventions/Progress Updates:    Pt rolled herself down to the kitchen with increased time and modified independence.  In the kitchen therapist educated her on walker safety while making a cup of coffee.  Pt reports that she usually makes coffee in the mornings.  Discussed use of the countertops and use of a walker bag to help transport items around safely.  She was able to verbally organize the task and then use the RW to gather items and make the coffee using the electric coffee maker.  Pt still needing mod instructional cueing for hand placement with stand to sit  transitions as she pushes up from the chair during standing but did not reach back to the chair when sitting down.  No LOB noted during session.  Finished by pushing pt back to her room and transferring back to the bed with min assist to lift her LEs in the bed.   Therapy Documentation Precautions:  Precautions Precautions: Fall Required Braces or Orthoses: Other Brace/Splint Other Brace/Splint: cam walker left foot (to be worn at all times, cover with plastic during bathing if using tub bench) Restrictions Weight Bearing Restrictions: No LLE Weight Bearing: Weight bearing as tolerated  Pain: Pain Assessment Pain Assessment: 0-10 Pain Score: 5  Pain Type: Acute pain Pain Location: Hip Pain Orientation: Left Pain Descriptors / Indicators: Sore Pain Frequency: Intermittent Pain Onset: Gradual Pain Intervention(s): Medication (See eMAR);RN made aware;Repositioned;Ambulation/increased activity;Emotional support Multiple Pain Sites: No ADL: See FIM for current functional status  Therapy/Group: Individual Therapy  Delon Sacramento OTR/L 08/16/2013, 4:00 PM

## 2013-08-17 DIAGNOSIS — M47812 Spondylosis without myelopathy or radiculopathy, cervical region: Secondary | ICD-10-CM

## 2013-08-17 DIAGNOSIS — S329XXA Fracture of unspecified parts of lumbosacral spine and pelvis, initial encounter for closed fracture: Secondary | ICD-10-CM

## 2013-08-17 DIAGNOSIS — S82409A Unspecified fracture of shaft of unspecified fibula, initial encounter for closed fracture: Secondary | ICD-10-CM

## 2013-08-17 MED ORDER — CITALOPRAM HYDROBROMIDE 40 MG PO TABS: 40.0000 mg | ORAL_TABLET | Freq: Every day | ORAL | Status: DC

## 2013-08-17 MED ORDER — DICLOFENAC SODIUM 1 % TD GEL: 2.0000 g | Freq: Four times a day (QID) | TRANSDERMAL | Status: AC

## 2013-08-17 MED ORDER — PANTOPRAZOLE SODIUM 40 MG PO TBEC: 40.0000 mg | DELAYED_RELEASE_TABLET | Freq: Every day | ORAL | Status: DC

## 2013-08-17 MED ORDER — SENNOSIDES-DOCUSATE SODIUM 8.6-50 MG PO TABS: 2.0000 | ORAL_TABLET | Freq: Two times a day (BID) | ORAL | Status: DC

## 2013-08-17 MED ORDER — HYDROXYZINE HCL 10 MG PO TABS: 10.0000 mg | ORAL_TABLET | Freq: Three times a day (TID) | ORAL | Status: DC | PRN

## 2013-08-17 MED ORDER — ACETAMINOPHEN 325 MG PO TABS: 650.0000 mg | ORAL_TABLET | ORAL | Status: AC | PRN

## 2013-08-17 MED ORDER — HYDROCODONE-ACETAMINOPHEN 5-325 MG PO TABS: 1.0000 | ORAL_TABLET | Freq: Four times a day (QID) | ORAL | Status: DC | PRN

## 2013-08-17 MED ORDER — FERROUS FUMARATE 325 (106 FE) MG PO TABS: 1.0000 | ORAL_TABLET | Freq: Every day | ORAL | Status: DC

## 2013-08-17 NOTE — Progress Notes (Signed)
Social Work Discharge Note Discharge Note  The overall goal for the admission was met for:   Discharge location: Yes-HOME WITH SON AND DAUGHTER IN-[place] UNTIL CAN RETURN TO HER HOME  Length of Stay: Yes-7 DAYS  Discharge activity level: Yes-MOD/I-SUPERVISION LEVEL  Home/community participation: Yes  Services provided included: MD, RD, PT, OT, RN, CM, TR, Pharmacy, Neuropsych and SW  Financial Services: Private Insurance: [place]  Follow-up services arranged: Home Health: [facility]-PT,RN, OT, DME: [facility] ROLLING WALKER, BSC,TUB BENCH and Patient/Family has no preference for HH/DME agencies  Comments (or additional information):PT DID [place] FEELS PREPARED TO [place].    Patient/Family verbalized understanding of follow-up arrangements: Yes  Individual responsible for coordination of the follow-up plan: SELF & RONALD-SON Confirmed correct DME delivered: Elease Hashimoto 08/17/2013    Gardiner Rhyme Remo Kirschenmann
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
## 2013-08-17 NOTE — Progress Notes (Signed)
Occupational Therapy Discharge Summary  Patient Details  Name: Tammy Santiago MRN: [id number] Date of Birth: 07/15/1950  Today's Date: 08/17/2013    Patient has met 9 of 9 long term goals due to improved activity tolerance, improved balance and ability to compensate for deficits.  Patient to discharge at overall Modified Independent level with basic self  Care, min A to tub bench, and supervision with light meal prep/ housekeeping.  Patient's care partner is independent to provide the necessary physical assistance at discharge.    Reasons goals not met: n/a  Recommendation:  Patient will benefit from ongoing skilled OT services in home health setting to continue to advance functional skills in the area of BADL and iADL.  Equipment: tub bench, BSC  Reasons for discharge: treatment goals met  Patient/family agrees with progress made and goals achieved: Yes  OT Discharge ADL ADL ADL Comments: mod I overall, except for min A with tub bench transfers Vision/Perception  Vision- History Baseline Vision/History: Wears glasses Wears Glasses: At all times Vision- Assessment Additional Comments: Pt states her blurry vision has cleared.  Cognition Overall Cognitive Status: Within Functional Limits for tasks assessed Sensation Sensation Light Touch: Appears Intact Stereognosis: Appears Intact Hot/Cold: Appears Intact Coordination Gross Motor Movements are Fluid and Coordinated: Yes Fine Motor Movements are Fluid and Coordinated: Yes Motor  Motor Motor - Discharge Observations: generalized weakness, improved since admission Mobility    mod I with RW to BSC or toilet Trunk/Postural Assessment  Cervical Assessment Cervical Assessment: Within Functional Limits Thoracic Assessment Thoracic Assessment: Within Functional Limits Lumbar Assessment Lumbar Assessment: Within Functional Limits Postural Control Postural Control: Within Functional Limits  Balance Static Sitting  Balance Static Sitting - Level of Assistance: 7: Independent Dynamic Sitting Balance Dynamic Sitting - Level of Assistance: 7: Independent Static Standing Balance Static Standing - Level of Assistance: 6: Modified independent (Device/Increase time) (with RW) Dynamic Standing Balance Dynamic Standing - Level of Assistance: 6: Modified independent (Device/Increase time) (with RW during self care tasks) Extremity/Trunk Assessment RUE Assessment RUE Assessment: Within Functional Limits LUE Assessment LUE Assessment: Within Functional Limits  See FIM for current functional status  Julia R Saguier 08/17/2013, 8:03 AM  

## 2013-08-17 NOTE — Progress Notes (Signed)
Social Work Patient ID: Tammy Santiago, female   DOB: [birth year], 63 y.o.   MRN: [id number] Trying to schedule PCP appointment for pt prior to discharge, but pt unsure of her MD and feels she has information at home.  She will set up appointment herself upon returning home Today. Other discharge needs set up and ready for discharge today.
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
## 2013-08-17 NOTE — Progress Notes (Signed)
Pt discharged home with family. Discharge instructions provided by Marissa Nestle, PA. Pt verbalized understanding. All questions answered. Pt given Vicodin x 1 prior to discharge to assist with discomfort during transport. Pt escorted off unit in w/c with personal belonging by Windy Carina, NT.
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
## 2013-08-17 NOTE — Discharge Instructions (Signed)
Inpatient Rehab Discharge Instructions  Tammy Santiago Discharge date and time:  08/17/13  Activities/Precautions/ Functional Status: Activity: activity as tolerated. Wear boot on left foot at all times.  Diet: regular diet Wound Care: none needed  Functional status:  ___ No restrictions     ___ Walk up steps independently _X__ 24/7 supervision/assistance   ___ Walk up steps with assistance ___ Intermittent supervision/assistance  ___ Bathe/dress independently ___ Walk with walker     ___ Bathe/dress with assistance ___ Walk Independently    ___ Shower independently _X__ Walk with supervision.      _X__ Shower with assistance _X__ No alcohol     ___ Return to work/school ________  Special Instructions: 1. No Driving till cleared by MD.  2. Practice incentive spirometry--deep breathing exercises every 2 hours.   COMMUNITY REFERRALS UPON DISCHARGE:    Home Health:   PT, OT, RN  Agency:[facility] Phone:[phone] Date of last service:08/17/2013  Medical Equipment/Items Ordered:[name], BEDSIDE COMMODE & TUB BENCH  Agency/Supplier:[facility]   [phone]     My questions have been answered and I understand these instructions. I will adhere to these goals and the provided educational materials after my discharge from the hospital.  Patient/Caregiver Signature _______________________________ Date __________  Clinician Signature _______________________________________ Date __________  Please bring this form and your medication list with you to all your follow-up doctor's appointments.

## 2013-08-24 ENCOUNTER — Encounter: Payer: Self-pay | Admitting: Internal Medicine

## 2013-08-24 ENCOUNTER — Ambulatory Visit (INDEPENDENT_AMBULATORY_CARE_PROVIDER_SITE_OTHER): Payer: BC Managed Care – PPO | Admitting: Internal Medicine

## 2013-08-24 VITALS — BP 120/78 | HR 84 | Temp 98.3°F | Wt 137.8 lb

## 2013-08-24 DIAGNOSIS — R350 Frequency of micturition: Secondary | ICD-10-CM

## 2013-08-24 DIAGNOSIS — M940 Chondrocostal junction syndrome [Tietze]: Secondary | ICD-10-CM

## 2013-08-24 DIAGNOSIS — R32 Unspecified urinary incontinence: Secondary | ICD-10-CM

## 2013-08-24 MED ORDER — MIRABEGRON ER 25 MG PO TB24: 25.0000 mg | ORAL_TABLET | Freq: Every day | ORAL | Status: DC

## 2013-08-24 NOTE — Patient Instructions (Signed)
Use an anti-inflammatory cream such as Aspercreme or Zostrix cream twice a day to the affected area as needed. In lieu of this warm moist compresses or  hot water bottle can be used. Do not apply ice . Myrbetriq 25 mg  ONCE DAILY
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
## 2013-08-24 NOTE — Progress Notes (Signed)
Pre visit review using our clinic review tool, if applicable. No additional management support is needed unless otherwise documented below in the visit note. 

## 2013-08-27 DIAGNOSIS — S20219A Contusion of unspecified front wall of thorax, initial encounter: Secondary | ICD-10-CM | POA: Diagnosis not present

## 2013-08-27 DIAGNOSIS — S8290XD Unspecified fracture of unspecified lower leg, subsequent encounter for closed fracture with routine healing: Secondary | ICD-10-CM | POA: Diagnosis not present

## 2013-08-27 DIAGNOSIS — F3289 Other specified depressive episodes: Secondary | ICD-10-CM | POA: Diagnosis not present

## 2013-08-27 DIAGNOSIS — F329 Major depressive disorder, single episode, unspecified: Secondary | ICD-10-CM | POA: Diagnosis not present

## 2013-08-27 DIAGNOSIS — I10 Essential (primary) hypertension: Secondary | ICD-10-CM

## 2013-08-27 DIAGNOSIS — S7290XD Unspecified fracture of unspecified femur, subsequent encounter for closed fracture with routine healing: Secondary | ICD-10-CM | POA: Diagnosis not present

## 2013-09-17 ENCOUNTER — Other Ambulatory Visit: Payer: Self-pay | Admitting: *Deleted

## 2013-09-17 MED ORDER — CITALOPRAM HYDROBROMIDE 40 MG PO TABS: 40.0000 mg | ORAL_TABLET | Freq: Every day | ORAL | Status: DC

## 2013-09-17 NOTE — Telephone Encounter (Signed)
Pt is wanting a 90 day sent on her citalopram. She states its cheaper instead getting 30 day. Inform pt will send to [facility]/[place]...Raechel Chute/lmb

## 2013-12-21 ENCOUNTER — Other Ambulatory Visit: Payer: Self-pay

## 2013-12-21 DIAGNOSIS — Z1239 Encounter for other screening for malignant neoplasm of breast: Secondary | ICD-10-CM

## 2013-12-30 ENCOUNTER — Ambulatory Visit: Payer: BC Managed Care – PPO

## 2014-01-11 ENCOUNTER — Other Ambulatory Visit: Payer: Self-pay

## 2014-01-11 ENCOUNTER — Ambulatory Visit
Admission: RE | Admit: 2014-01-11 | Discharge: 2014-01-11 | Disposition: A | Discharge status: Home / Self Care | Payer: BC Managed Care – PPO | Source: Ambulatory Visit

## 2014-01-11 DIAGNOSIS — Z1231 Encounter for screening mammogram for malignant neoplasm of breast: Secondary | ICD-10-CM

## 2014-01-11 LAB — HM MAMMOGRAPHY: HM Mammogram: NORMAL

## 2014-05-23 ENCOUNTER — Other Ambulatory Visit: Payer: Self-pay | Admitting: Internal Medicine

## 2014-05-23 NOTE — Telephone Encounter (Signed)
OK X 1  Needs to establish with PCP

## 2014-05-23 NOTE — Telephone Encounter (Signed)
Do you want to fill this for patient?

## 2014-05-31 ENCOUNTER — Encounter: Payer: Self-pay | Admitting: Medical

## 2014-06-02 ENCOUNTER — Other Ambulatory Visit: Payer: Self-pay | Admitting: Internal Medicine

## 2014-06-02 ENCOUNTER — Ambulatory Visit
Admission: RE | Admit: 2014-06-02 | Discharge: 2014-06-02 | Disposition: A | Discharge status: Home / Self Care | Payer: 59 | Source: Ambulatory Visit | Attending: Internal Medicine | Admitting: Internal Medicine

## 2014-06-02 DIAGNOSIS — R059 Cough, unspecified: Secondary | ICD-10-CM

## 2014-06-02 DIAGNOSIS — R05 Cough: Secondary | ICD-10-CM

## 2015-03-10 ENCOUNTER — Other Ambulatory Visit: Payer: Self-pay

## 2015-03-10 DIAGNOSIS — Z1231 Encounter for screening mammogram for malignant neoplasm of breast: Secondary | ICD-10-CM

## 2015-03-24 ENCOUNTER — Ambulatory Visit: Payer: Self-pay

## 2015-04-05 ENCOUNTER — Ambulatory Visit
Admission: RE | Admit: 2015-04-05 | Discharge: 2015-04-05 | Disposition: A | Discharge status: Home / Self Care | Payer: Medicare HMO | Source: Ambulatory Visit

## 2015-04-05 ENCOUNTER — Ambulatory Visit
Admission: RE | Admit: 2015-04-05 | Discharge: 2015-04-05 | Disposition: A | Discharge status: Home / Self Care | Payer: Medicare HMO | Source: Ambulatory Visit | Attending: Internal Medicine | Admitting: Internal Medicine

## 2015-04-05 ENCOUNTER — Other Ambulatory Visit: Payer: Self-pay | Admitting: Internal Medicine

## 2015-04-05 DIAGNOSIS — M25561 Pain in right knee: Secondary | ICD-10-CM

## 2015-04-05 DIAGNOSIS — Z1231 Encounter for screening mammogram for malignant neoplasm of breast: Secondary | ICD-10-CM

## 2015-07-11 ENCOUNTER — Other Ambulatory Visit: Payer: Self-pay | Admitting: Internal Medicine

## 2015-07-11 ENCOUNTER — Other Ambulatory Visit (HOSPITAL_COMMUNITY): Payer: Self-pay | Admitting: Internal Medicine

## 2015-07-11 DIAGNOSIS — E2839 Other primary ovarian failure: Secondary | ICD-10-CM

## 2015-07-11 DIAGNOSIS — R0989 Other specified symptoms and signs involving the circulatory and respiratory systems: Secondary | ICD-10-CM

## 2015-07-12 ENCOUNTER — Ambulatory Visit (HOSPITAL_COMMUNITY)
Admission: RE | Admit: 2015-07-12 | Discharge: 2015-07-12 | Disposition: A | Discharge status: Home / Self Care | Payer: Medicare HMO | Source: Ambulatory Visit | Attending: Internal Medicine | Admitting: Internal Medicine

## 2015-07-12 DIAGNOSIS — I6523 Occlusion and stenosis of bilateral carotid arteries: Secondary | ICD-10-CM | POA: Insufficient documentation

## 2015-07-12 DIAGNOSIS — I10 Essential (primary) hypertension: Secondary | ICD-10-CM | POA: Insufficient documentation

## 2015-07-12 DIAGNOSIS — R0989 Other specified symptoms and signs involving the circulatory and respiratory systems: Secondary | ICD-10-CM | POA: Insufficient documentation

## 2015-07-12 NOTE — Progress Notes (Signed)
*  PRELIMINARY RESULTS* Vascular Ultrasound Carotid Duplex (Doppler) has been completed.  Preliminary findings: Right 40-59%, upper end of scale, ICA stenosis. Left 1-39% ICA stenosis.  Vertebral arteries patent with antegrade flow.    Farrel DemarkJill Eunice, RDMS, RVT  07/12/2015, 11:17 AM

## 2015-07-27 ENCOUNTER — Ambulatory Visit
Admission: RE | Admit: 2015-07-27 | Discharge: 2015-07-27 | Disposition: A | Discharge status: Home / Self Care | Payer: Medicare HMO | Source: Ambulatory Visit | Attending: Internal Medicine | Admitting: Internal Medicine

## 2015-07-27 DIAGNOSIS — E2839 Other primary ovarian failure: Secondary | ICD-10-CM

## 2016-11-05 ENCOUNTER — Other Ambulatory Visit: Payer: Self-pay | Admitting: Internal Medicine

## 2016-11-05 DIAGNOSIS — Z1231 Encounter for screening mammogram for malignant neoplasm of breast: Secondary | ICD-10-CM

## 2016-11-13 ENCOUNTER — Ambulatory Visit
Admission: RE | Admit: 2016-11-13 | Discharge: 2016-11-13 | Disposition: A | Discharge status: Home / Self Care | Payer: Medicare HMO | Source: Ambulatory Visit | Attending: Internal Medicine | Admitting: Internal Medicine

## 2016-11-13 DIAGNOSIS — Z1231 Encounter for screening mammogram for malignant neoplasm of breast: Secondary | ICD-10-CM

## 2017-05-19 ENCOUNTER — Ambulatory Visit: Payer: Medicare HMO | Admitting: Family Medicine

## 2017-05-21 ENCOUNTER — Ambulatory Visit: Payer: Self-pay | Admitting: Family Medicine

## 2017-09-01 ENCOUNTER — Other Ambulatory Visit: Payer: Self-pay | Admitting: Internal Medicine

## 2017-09-01 DIAGNOSIS — R3129 Other microscopic hematuria: Secondary | ICD-10-CM

## 2017-09-08 ENCOUNTER — Other Ambulatory Visit: Payer: Self-pay

## 2017-10-27 ENCOUNTER — Other Ambulatory Visit: Payer: Self-pay | Admitting: Internal Medicine

## 2017-10-27 DIAGNOSIS — Z1231 Encounter for screening mammogram for malignant neoplasm of breast: Secondary | ICD-10-CM

## 2017-12-02 ENCOUNTER — Ambulatory Visit
Admission: RE | Admit: 2017-12-02 | Discharge: 2017-12-02 | Disposition: A | Discharge status: Home / Self Care | Payer: Medicare Other | Source: Ambulatory Visit | Attending: Internal Medicine | Admitting: Internal Medicine

## 2017-12-02 DIAGNOSIS — Z1231 Encounter for screening mammogram for malignant neoplasm of breast: Secondary | ICD-10-CM

## 2019-05-13 ENCOUNTER — Ambulatory Visit: Payer: Medicare Other | Attending: Internal Medicine

## 2019-05-13 DIAGNOSIS — Z23 Encounter for immunization: Secondary | ICD-10-CM | POA: Insufficient documentation

## 2019-05-13 NOTE — Progress Notes (Signed)
   Covid-19 Vaccination Clinic  Name:  MAGDELENE RUARK    MRN: [id number] DOB: 11/07/1950  05/13/2019  Ms. Dack was observed post Covid-19 immunization for 15 minutes without incident. She was provided with Vaccine Information Sheet and instruction to access the V-Safe system.   Ms. Obremski was instructed to call 911 with any severe reactions post vaccine: Marland Kitchen Difficulty breathing  . Swelling of face and throat  . A fast heartbeat  . A bad rash all over body  . Dizziness and weakness   Immunizations Administered    Name Date Dose VIS Date Route   Pfizer COVID-19 Vaccine 05/13/2019  4:45 PM 0.3 mL 02/19/2019 Intramuscular   Manufacturer: [employer], [employer]   Lot: [id number]   NDC: [address]-1

## 2019-06-09 ENCOUNTER — Ambulatory Visit: Payer: Medicare Other | Attending: Internal Medicine

## 2019-06-09 ENCOUNTER — Other Ambulatory Visit: Payer: Self-pay | Admitting: Internal Medicine

## 2019-06-09 DIAGNOSIS — Z23 Encounter for immunization: Secondary | ICD-10-CM

## 2019-06-09 DIAGNOSIS — E2839 Other primary ovarian failure: Secondary | ICD-10-CM

## 2019-06-09 NOTE — Progress Notes (Signed)
   Covid-19 Vaccination Clinic  Name:  Tammy Santiago    MRN: [id number] DOB: Oct 07, 1950  06/09/2019  [name] was observed post Covid-19 immunization for 15 minutes without incident. She was provided with Vaccine Information Sheet and instruction to access the V-Safe system.   [name] was instructed to call 911 with any severe reactions post vaccine: Marland Kitchen Difficulty breathing  . Swelling of face and throat  . A fast heartbeat  . A bad rash all over body  . Dizziness and weakness   Immunizations Administered    Name Date Dose VIS Date Route   Pfizer COVID-19 Vaccine 06/09/2019  3:42 PM 0.3 mL 02/19/2019 Intramuscular   Manufacturer: [employer], [employer]   Lot: [id number]   NDC: [address]-9

## 2019-09-28 ENCOUNTER — Ambulatory Visit: Payer: Medicare Other | Admitting: Family Medicine

## 2019-12-14 ENCOUNTER — Ambulatory Visit: Payer: Medicare Other | Admitting: Family Medicine

## 2020-07-06 ENCOUNTER — Other Ambulatory Visit: Payer: Self-pay | Admitting: Family

## 2020-07-06 DIAGNOSIS — Z1231 Encounter for screening mammogram for malignant neoplasm of breast: Secondary | ICD-10-CM

## 2020-08-25 ENCOUNTER — Other Ambulatory Visit: Payer: Self-pay

## 2020-08-25 ENCOUNTER — Ambulatory Visit
Admission: RE | Admit: 2020-08-25 | Discharge: 2020-08-25 | Disposition: A | Discharge status: Home / Self Care | Payer: Medicare (Managed Care) | Source: Ambulatory Visit | Attending: Family | Admitting: Family

## 2020-08-25 DIAGNOSIS — Z1231 Encounter for screening mammogram for malignant neoplasm of breast: Secondary | ICD-10-CM

## 2021-05-17 DIAGNOSIS — M25552 Pain in left hip: Secondary | ICD-10-CM | POA: Diagnosis not present

## 2021-05-17 DIAGNOSIS — E785 Hyperlipidemia, unspecified: Secondary | ICD-10-CM | POA: Diagnosis not present

## 2021-05-17 DIAGNOSIS — F419 Anxiety disorder, unspecified: Secondary | ICD-10-CM | POA: Diagnosis not present

## 2021-05-17 DIAGNOSIS — M25551 Pain in right hip: Secondary | ICD-10-CM | POA: Diagnosis not present

## 2021-09-25 ENCOUNTER — Other Ambulatory Visit: Payer: Self-pay

## 2021-09-25 ENCOUNTER — Ambulatory Visit
Admission: EM | Admit: 2021-09-25 | Discharge: 2021-09-25 | Disposition: A | Discharge status: Home / Self Care | Payer: Medicare (Managed Care) | Attending: Physician Assistant | Admitting: Physician Assistant

## 2021-09-25 ENCOUNTER — Encounter: Payer: Self-pay | Admitting: Emergency Medicine

## 2021-09-25 DIAGNOSIS — F32A Depression, unspecified: Secondary | ICD-10-CM

## 2021-09-25 MED ORDER — CITALOPRAM HYDROBROMIDE 40 MG PO TABS: 40.0000 mg | ORAL_TABLET | Freq: Every day | ORAL | 0 refills | Status: DC

## 2021-09-25 NOTE — ED Triage Notes (Signed)
Patient requesting refill of anxiety and depression medicine.  Patient reports no pcp.  Patient has been out of medicines for 3 days

## 2021-09-25 NOTE — Discharge Instructions (Addendum)
Return if any problems.
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
## 2021-10-24 ENCOUNTER — Encounter: Payer: Self-pay | Admitting: Internal Medicine

## 2021-10-24 ENCOUNTER — Other Ambulatory Visit: Payer: Self-pay

## 2021-10-24 ENCOUNTER — Ambulatory Visit (INDEPENDENT_AMBULATORY_CARE_PROVIDER_SITE_OTHER): Payer: Medicare (Managed Care) | Admitting: Internal Medicine

## 2021-10-24 VITALS — BP 105/54 | HR 78 | Temp 98.1°F | Ht 59.0 in | Wt 129.6 lb

## 2021-10-24 DIAGNOSIS — F329 Major depressive disorder, single episode, unspecified: Secondary | ICD-10-CM

## 2021-10-24 DIAGNOSIS — Z1211 Encounter for screening for malignant neoplasm of colon: Secondary | ICD-10-CM

## 2021-10-24 DIAGNOSIS — M25552 Pain in left hip: Secondary | ICD-10-CM | POA: Diagnosis not present

## 2021-10-24 DIAGNOSIS — E785 Hyperlipidemia, unspecified: Secondary | ICD-10-CM

## 2021-10-24 DIAGNOSIS — H409 Unspecified glaucoma: Secondary | ICD-10-CM

## 2021-10-24 DIAGNOSIS — Z0000 Encounter for general adult medical examination without abnormal findings: Secondary | ICD-10-CM

## 2021-10-24 DIAGNOSIS — Z9189 Other specified personal risk factors, not elsewhere classified: Secondary | ICD-10-CM

## 2021-10-24 DIAGNOSIS — E559 Vitamin D deficiency, unspecified: Secondary | ICD-10-CM | POA: Diagnosis not present

## 2021-10-24 DIAGNOSIS — M25551 Pain in right hip: Secondary | ICD-10-CM

## 2021-10-24 MED ORDER — SIMVASTATIN 40 MG PO TABS: 40.0000 mg | ORAL_TABLET | Freq: Every evening | ORAL | 11 refills | Status: DC

## 2021-10-24 NOTE — Patient Instructions (Addendum)
Tammy Santiago, it was a pleasure seeing you today! You endorsed feeling well today. Below are some of the things we talked about this visit. We look forward to seeing you in the follow up appointment!  Today we discussed: You presented for a follow up and physical exam to be cleared for driving. You are doing well. Continue taking your medications. We will do some x rays as your hip has been bothering you. We will plan further after that.     I have ordered the following labs today:  Lab Orders  No laboratory test(s) ordered today      Referrals ordered today:   Referral Orders  No referral(s) requested today     I have ordered the following medication/changed the following medications:   Stop the following medications: Medications Discontinued During This Encounter  Medication Reason   HYDROcodone-acetaminophen (NORCO/VICODIN) 5-325 MG per tablet Patient has not taken in last 30 days   hydrOXYzine (ATARAX/VISTARIL) 10 MG tablet Patient has not taken in last 30 days   pantoprazole (PROTONIX) 40 MG tablet Patient has not taken in last 30 days   ferrous fumarate (HEMOCYTE - 106 MG FE) 325 (106 FE) MG TABS tablet Patient has not taken in last 30 days   mirabegron ER (MYRBETRIQ) 25 MG TB24 tablet Patient has not taken in last 30 days     Start the following medications: No orders of the defined types were placed in this encounter.    Follow-up: 3-4 months follow up with Dr. Welton Flakes  Please make sure to arrive 15 minutes prior to your next appointment. If you arrive late, you may be asked to reschedule.   We look forward to seeing you next time. Please call our clinic at [phone] if you have any questions or concerns. The best time to call is Monday-Friday from 9am-4pm, but there is someone available 24/7. If after hours or the weekend, call the main hospital number and ask for the Internal Medicine Resident On-Call. If you need medication refills, please notify your  pharmacy one week in advance and they will send [place] a request.  Thank you for letting us take part in your care. Wishing you the best!  Thank you, Gwenevere Abbot, MD
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
## 2021-10-26 ENCOUNTER — Encounter: Payer: Self-pay | Admitting: Internal Medicine

## 2021-10-26 DIAGNOSIS — F329 Major depressive disorder, single episode, unspecified: Secondary | ICD-10-CM | POA: Insufficient documentation

## 2021-10-26 DIAGNOSIS — Z9189 Other specified personal risk factors, not elsewhere classified: Secondary | ICD-10-CM | POA: Insufficient documentation

## 2021-10-26 DIAGNOSIS — H409 Unspecified glaucoma: Secondary | ICD-10-CM | POA: Insufficient documentation

## 2021-10-26 NOTE — Assessment & Plan Note (Signed)
Patient has HLD and is on Zocor 40 mg qd. LDL at goal with last result in 05/2021 showing LDL a 81.  -Continue current regimen.

## 2021-10-26 NOTE — Progress Notes (Signed)
Internal Medicine Clinic Attending  Case discussed with Dr. Khan  At the time of the visit.  We reviewed the resident's history and exam and pertinent patient test results.  I agree with the assessment, diagnosis, and plan of care documented in the resident's note.  

## 2021-10-26 NOTE — Assessment & Plan Note (Signed)
Colonoscopy ordered this visit

## 2021-10-26 NOTE — Assessment & Plan Note (Signed)
Patient presented to get clearance for driving as DMV wanted rule out any medical conditions that limited her driving. She had an eye exam that was within normal limits. On my assessment, she does not have any medical problems limiting her driving but she will benefit from a road test.  -DMV paperwork filled out.
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
## 2021-10-26 NOTE — Assessment & Plan Note (Signed)
Patient has hx of glaucoma and is on Bimatoprost 1 drop daily. -Continue current eye drops and refer for an eye exam next visit.

## 2021-10-26 NOTE — Assessment & Plan Note (Addendum)
Bone density testing in 2017 showed osteopenia with bone density showing T score of -1.1. Will perform Vit D testing at next visit and supplement if needed. Vitamin D level checked 10/2020 was 65. Pt currently taking multivitamin daily.  -Continue current multivitamin.

## 2021-10-29 ENCOUNTER — Ambulatory Visit (HOSPITAL_BASED_OUTPATIENT_CLINIC_OR_DEPARTMENT_OTHER): Payer: Medicare (Managed Care) | Admitting: Family Medicine

## 2021-11-21 DIAGNOSIS — H25813 Combined forms of age-related cataract, bilateral: Secondary | ICD-10-CM | POA: Diagnosis not present

## 2021-11-21 DIAGNOSIS — H401134 Primary open-angle glaucoma, bilateral, indeterminate stage: Secondary | ICD-10-CM | POA: Diagnosis not present

## 2021-12-24 DIAGNOSIS — H269 Unspecified cataract: Secondary | ICD-10-CM | POA: Diagnosis not present

## 2021-12-24 DIAGNOSIS — H25812 Combined forms of age-related cataract, left eye: Secondary | ICD-10-CM | POA: Diagnosis not present

## 2021-12-24 DIAGNOSIS — H25813 Combined forms of age-related cataract, bilateral: Secondary | ICD-10-CM | POA: Diagnosis not present

## 2021-12-24 DIAGNOSIS — H21562 Pupillary abnormality, left eye: Secondary | ICD-10-CM | POA: Diagnosis not present

## 2021-12-31 ENCOUNTER — Other Ambulatory Visit: Payer: Self-pay

## 2022-01-01 MED ORDER — CITALOPRAM HYDROBROMIDE 40 MG PO TABS: 40.0000 mg | ORAL_TABLET | Freq: Every day | ORAL | 0 refills | Status: DC

## 2022-04-16 ENCOUNTER — Ambulatory Visit: Payer: Medicare (Managed Care) | Admitting: Family Medicine

## 2022-04-16 ENCOUNTER — Telehealth: Payer: Self-pay | Admitting: Family Medicine

## 2022-04-16 NOTE — Telephone Encounter (Signed)
2.6.24 no show letter sent 

## 2022-04-22 NOTE — Telephone Encounter (Signed)
Pt has history of no shows within [facility]. Pt wants to try to get here again. She is wanting a female doctor. She said she doesn't normally drive and will have [employer] provide transportation 2/22 to come to her appointment.

## 2022-04-24 ENCOUNTER — Other Ambulatory Visit: Payer: Self-pay | Admitting: Internal Medicine

## 2022-04-25 ENCOUNTER — Other Ambulatory Visit: Payer: Self-pay | Admitting: Internal Medicine

## 2022-04-29 ENCOUNTER — Ambulatory Visit: Payer: Medicare (Managed Care) | Admitting: Internal Medicine

## 2022-05-01 ENCOUNTER — Encounter: Payer: Medicare (Managed Care) | Admitting: Internal Medicine

## 2022-05-02 ENCOUNTER — Ambulatory Visit: Payer: Medicare (Managed Care) | Admitting: Family Medicine

## 2022-05-10 ENCOUNTER — Ambulatory Visit: Payer: Medicare (Managed Care) | Admitting: Internal Medicine

## 2022-05-17 DIAGNOSIS — H401131 Primary open-angle glaucoma, bilateral, mild stage: Secondary | ICD-10-CM | POA: Diagnosis not present

## 2022-05-17 DIAGNOSIS — H25811 Combined forms of age-related cataract, right eye: Secondary | ICD-10-CM | POA: Diagnosis not present

## 2022-05-17 DIAGNOSIS — Z961 Presence of intraocular lens: Secondary | ICD-10-CM | POA: Diagnosis not present

## 2022-05-17 DIAGNOSIS — H59032 Cystoid macular edema following cataract surgery, left eye: Secondary | ICD-10-CM | POA: Diagnosis not present

## 2022-05-24 ENCOUNTER — Ambulatory Visit: Payer: Medicare (Managed Care) | Admitting: Internal Medicine

## 2022-05-27 ENCOUNTER — Other Ambulatory Visit: Payer: Self-pay | Admitting: Internal Medicine

## 2022-06-11 DIAGNOSIS — H59032 Cystoid macular edema following cataract surgery, left eye: Secondary | ICD-10-CM | POA: Diagnosis not present

## 2022-06-11 DIAGNOSIS — H20042 Secondary noninfectious iridocyclitis, left eye: Secondary | ICD-10-CM | POA: Diagnosis not present

## 2022-06-18 ENCOUNTER — Other Ambulatory Visit: Payer: Self-pay | Admitting: Internal Medicine

## 2022-06-19 ENCOUNTER — Ambulatory Visit
Admission: EM | Admit: 2022-06-19 | Discharge: 2022-06-19 | Disposition: A | Discharge status: Home / Self Care | Payer: Medicare (Managed Care) | Attending: Internal Medicine | Admitting: Internal Medicine

## 2022-06-19 DIAGNOSIS — E785 Hyperlipidemia, unspecified: Secondary | ICD-10-CM | POA: Diagnosis not present

## 2022-06-19 DIAGNOSIS — M545 Low back pain, unspecified: Secondary | ICD-10-CM

## 2022-06-19 DIAGNOSIS — R45851 Suicidal ideations: Secondary | ICD-10-CM | POA: Diagnosis not present

## 2022-06-19 DIAGNOSIS — F419 Anxiety disorder, unspecified: Secondary | ICD-10-CM | POA: Diagnosis not present

## 2022-06-19 DIAGNOSIS — F32A Depression, unspecified: Secondary | ICD-10-CM

## 2022-06-19 MED ORDER — LIDOCAINE 5 % EX PTCH: 1.0000 | MEDICATED_PATCH | CUTANEOUS | 0 refills | Status: AC

## 2022-06-19 MED ORDER — SIMVASTATIN 40 MG PO TABS: 40.0000 mg | ORAL_TABLET | Freq: Every evening | ORAL | 0 refills | Status: DC

## 2022-06-19 MED ORDER — CITALOPRAM HYDROBROMIDE 40 MG PO TABS: 40.0000 mg | ORAL_TABLET | Freq: Every day | ORAL | 0 refills | Status: DC

## 2022-06-19 NOTE — ED Triage Notes (Signed)
Patient here today for medication refill of Citalopram 40 mg once daily and Simvastatin 40 mg once in the evening. She is in between primary care providers. She has an appointment but she can't get in until June.   She has also been having left side LB pain radiating down left leg X 1 month. Patient has been having LB pain for a while now but has been worsening.

## 2022-06-19 NOTE — Discharge Instructions (Signed)
Medications have been refilled.  Please follow-up at behavioral health urgent care as soon as possible for further evaluation and management.  If you have any additional suicidal thoughts, please go to the emergency department immediately or call 911.  Lidocaine patch has been prescribed for back pain.  Follow-up with orthopedist and PCP for further evaluation and management of this.
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
## 2022-07-02 DIAGNOSIS — H59032 Cystoid macular edema following cataract surgery, left eye: Secondary | ICD-10-CM | POA: Diagnosis not present

## 2022-07-02 DIAGNOSIS — H20042 Secondary noninfectious iridocyclitis, left eye: Secondary | ICD-10-CM | POA: Diagnosis not present

## 2022-07-02 DIAGNOSIS — H401131 Primary open-angle glaucoma, bilateral, mild stage: Secondary | ICD-10-CM | POA: Diagnosis not present

## 2022-08-20 ENCOUNTER — Ambulatory Visit (INDEPENDENT_AMBULATORY_CARE_PROVIDER_SITE_OTHER): Payer: Medicare (Managed Care)

## 2022-08-20 ENCOUNTER — Encounter: Payer: Self-pay | Admitting: Emergency Medicine

## 2022-08-20 ENCOUNTER — Ambulatory Visit: Payer: Medicare (Managed Care) | Admitting: Emergency Medicine

## 2022-08-20 ENCOUNTER — Telehealth: Payer: Self-pay | Admitting: Emergency Medicine

## 2022-08-20 VITALS — BP 126/74 | HR 55 | Temp 98.1°F | Ht <= 58 in | Wt 132.1 lb

## 2022-08-20 DIAGNOSIS — Z7689 Persons encountering health services in other specified circumstances: Secondary | ICD-10-CM

## 2022-08-20 DIAGNOSIS — E785 Hyperlipidemia, unspecified: Secondary | ICD-10-CM | POA: Diagnosis not present

## 2022-08-20 DIAGNOSIS — M25552 Pain in left hip: Secondary | ICD-10-CM

## 2022-08-20 DIAGNOSIS — G8929 Other chronic pain: Secondary | ICD-10-CM

## 2022-08-20 LAB — CBC WITH DIFFERENTIAL/PLATELET
Basophils Absolute: 0 10*3/uL (ref 0.0–0.1)
Basophils Relative: 0.7 % (ref 0.0–3.0)
Eosinophils Absolute: 0.1 10*3/uL (ref 0.0–0.7)
Eosinophils Relative: 2.1 % (ref 0.0–5.0)
HCT: 36.8 % (ref 36.0–46.0)
Hemoglobin: 11.9 g/dL — ABNORMAL LOW (ref 12.0–15.0)
Lymphocytes Relative: 30.6 % (ref 12.0–46.0)
Lymphs Abs: 1.9 10*3/uL (ref 0.7–4.0)
MCHC: 32.5 g/dL (ref 30.0–36.0)
MCV: 87 fl (ref 78.0–100.0)
Monocytes Absolute: 0.5 10*3/uL (ref 0.1–1.0)
Monocytes Relative: 8 % (ref 3.0–12.0)
Neutro Abs: 3.6 10*3/uL (ref 1.4–7.7)
Neutrophils Relative %: 58.6 % (ref 43.0–77.0)
Platelets: 303 10*3/uL (ref 150.0–400.0)
RBC: 4.22 Mil/uL (ref 3.87–5.11)
RDW: 14.3 % (ref 11.5–15.5)
WBC: 6.2 10*3/uL (ref 4.0–10.5)

## 2022-08-20 LAB — COMPREHENSIVE METABOLIC PANEL
ALT: 9 U/L (ref 0–35)
AST: 18 U/L (ref 0–37)
Albumin: 4 g/dL (ref 3.5–5.2)
Alkaline Phosphatase: 34 U/L — ABNORMAL LOW (ref 39–117)
BUN: 24 mg/dL — ABNORMAL HIGH (ref 6–23)
CO2: 27 mEq/L (ref 19–32)
Calcium: 9.1 mg/dL (ref 8.4–10.5)
Chloride: 106 mEq/L (ref 96–112)
Creatinine, Ser: 0.7 mg/dL (ref 0.40–1.20)
GFR: 86.44 mL/min (ref >60.00)
Glucose, Bld: 71 mg/dL (ref 70–99)
Potassium: 3.8 mEq/L (ref 3.5–5.1)
Sodium: 138 mEq/L (ref 135–145)
Total Bilirubin: 0.7 mg/dL (ref 0.2–1.2)
Total Protein: 7.4 g/dL (ref 6.0–8.3)

## 2022-08-20 LAB — HEMOGLOBIN A1C: Hgb A1c MFr Bld: 5.3 % (ref 4.6–6.5)

## 2022-08-20 LAB — LIPID PANEL
Cholesterol: 161 mg/dL (ref 0–200)
HDL: 63 mg/dL (ref >39.00)
LDL Cholesterol: 84 mg/dL (ref 0–99)
NonHDL: 98.22
Total CHOL/HDL Ratio: 3
Triglycerides: 72 mg/dL (ref 0.0–149.0)
VLDL: 14.4 mg/dL (ref 0.0–40.0)

## 2022-08-20 MED ORDER — MELOXICAM 15 MG PO TABS: 15.0000 mg | ORAL_TABLET | Freq: Every day | ORAL | 0 refills | Status: AC

## 2022-08-20 MED ORDER — MELOXICAM 15 MG PO TABS: 15.0000 mg | ORAL_TABLET | Freq: Every day | ORAL | 0 refills | Status: DC

## 2022-08-20 NOTE — Patient Instructions (Signed)
Hip Pain The hip is the joint between the upper legs and the lower pelvis. The bones, cartilage, tendons, and muscles of your hip joint support your body and allow you to move around. Hip pain can range from a minor ache to severe pain in one or both of your hips. The pain may be felt on the inside of the hip joint near the groin, or on the outside near the buttocks and upper thigh. You may also have swelling or stiffness in your hip area. Follow these instructions at home: Managing pain, stiffness, and swelling     If told, put ice on the painful area. Put ice in a plastic bag. Place a towel between your skin and the bag. Leave the ice on for 20 minutes, 2-3 times a day. If told, apply heat to the affected area as often as told by your health care provider. Use the heat source that your provider recommends, such as a moist heat pack or a heating pad. Place a towel between your skin and the heat source. Leave the heat on for 20-30 minutes. If your skin turns bright red, remove the ice or heat right away to prevent skin damage. The risk of damage is higher if you cannot feel pain, heat, or cold. Activity Do exercises as told by your provider. Avoid activities that cause pain. General instructions  Take over-the-counter and prescription medicines only as told by your provider. Keep a journal of your symptoms. Write down: How often you have hip pain. The location of your pain. What the pain feels like. What makes the pain worse. Sleep with a pillow between your legs on your most comfortable side. Keep all follow-up visits. Your provider will monitor your pain and activity. Contact a health care provider if: You cannot put weight on your leg. Your pain or swelling gets worse after a week. It gets harder to walk. You have a fever. Get help right away if: You fall. You have a sudden increase in pain and swelling in your hip. Your hip is red or swollen or very tender to touch. This  information is not intended to replace advice given to you by your health care provider. Make sure you discuss any questions you have with your health care provider. Document Revised: 10/30/2021 Document Reviewed: 10/30/2021 Elsevier Patient Education  2024 Elsevier Inc.  

## 2022-08-20 NOTE — Assessment & Plan Note (Signed)
Active and affecting quality of life. Differential diagnosis discussed.  Osteoarthritis most likely Advised to use cane to avoid falls Fall precautions given Pain management discussed Recommend to start meloxicam 15 mg daily for 10 days X-ray done today.  Report reviewed Needs orthopedic evaluation.  Referral placed today.
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
## 2022-08-20 NOTE — Assessment & Plan Note (Signed)
Abnormal lipid profiles in the past Lipid profile done today Diet and nutrition discussed Continue simvastatin 40 mg daily

## 2022-08-27 ENCOUNTER — Other Ambulatory Visit: Payer: Self-pay

## 2022-08-27 ENCOUNTER — Ambulatory Visit (INDEPENDENT_AMBULATORY_CARE_PROVIDER_SITE_OTHER): Payer: Medicare (Managed Care) | Admitting: Orthopaedic Surgery

## 2022-08-27 DIAGNOSIS — M1612 Unilateral primary osteoarthritis, left hip: Secondary | ICD-10-CM
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
## 2022-08-29 ENCOUNTER — Telehealth: Payer: Self-pay | Admitting: Emergency Medicine

## 2022-08-29 DIAGNOSIS — E785 Hyperlipidemia, unspecified: Secondary | ICD-10-CM

## 2022-08-29 MED ORDER — SIMVASTATIN 40 MG PO TABS: 40.0000 mg | ORAL_TABLET | Freq: Every evening | ORAL | 0 refills | Status: DC

## 2022-08-29 MED ORDER — CITALOPRAM HYDROBROMIDE 40 MG PO TABS: 40.0000 mg | ORAL_TABLET | Freq: Every day | ORAL | 0 refills | Status: DC

## 2022-08-29 NOTE — Telephone Encounter (Signed)
Caller & Relationship to patient: Reid - Patient  Call back number: [phone]  Date of last office visit: 08/20/22  Date of next office visit: 11-21-22  Medication(s) to be refilled:  citalopram (CELEXA) 40 MG tablet     simvastatin (ZOCOR) 40 MG tablet      Preferred Pharmacy: [id number] - [place], [place] - [address]

## 2022-08-29 NOTE — Telephone Encounter (Signed)
Okay to prescribe?

## 2022-08-29 NOTE — Telephone Encounter (Signed)
New prescription sent to patient pharmacy.

## 2022-09-16 ENCOUNTER — Ambulatory Visit: Payer: Medicare (Managed Care) | Admitting: Sports Medicine

## 2022-09-26 ENCOUNTER — Other Ambulatory Visit: Payer: Self-pay

## 2022-09-26 ENCOUNTER — Ambulatory Visit: Payer: Medicare (Managed Care) | Admitting: Sports Medicine

## 2022-09-26 ENCOUNTER — Encounter: Payer: Self-pay | Admitting: Sports Medicine

## 2022-09-26 DIAGNOSIS — M1612 Unilateral primary osteoarthritis, left hip: Secondary | ICD-10-CM

## 2022-09-26 MED ORDER — LIDOCAINE HCL 1 % IJ SOLN
4.0000 mL | INTRAMUSCULAR | Status: AC | PRN
  Administered 2022-09-26: 4 mL

## 2022-09-26 MED ORDER — METHYLPREDNISOLONE ACETATE 40 MG/ML IJ SUSP
80.0000 mg | INTRAMUSCULAR | Status: AC | PRN
  Administered 2022-09-26: 80 mg via INTRA_ARTICULAR

## 2022-09-26 NOTE — Progress Notes (Signed)
   Office & Procedure Note  Patient: Tammy Santiago             Date of Birth: 06-Jun-1950           MRN: [id number]             Visit Date: 09/26/2022  Procedures: Visit Diagnoses:  1. Primary osteoarthritis of left hip    Large Joint Inj: L hip joint on 09/26/2022 10:25 AM Indications: pain Details: 22 G 3.5 in needle, ultrasound-guided anterior approach Medications: 4 mL lidocaine 1 %; 80 mg methylPREDNISolone acetate 40 MG/ML Outcome: tolerated well, no immediate complications  Procedure: US-guided intra-articular hip injection, left After discussion on risks/benefits/indications and informed verbal consent was obtained, a timeout was performed. Patient was lying supine on exam table. The hip was cleaned with betadine and alcohol swabs. Then utilizing ultrasound guidance, the patient's femoral head and neck junction was identified and subsequently injected with 4:2 lidocaine:depomedrol via an in-plane approach with ultrasound visualization of the injectate administered into the hip joint. Patient tolerated procedure well without immediate complications.  Procedure, treatment alternatives, risks and benefits explained, specific risks discussed. Consent was given by the patient. Immediately prior to procedure a time out was called to verify the correct patient, procedure, equipment, support staff and site/side marked as required. Patient was prepped and draped in the usual sterile fashion.     - I evaluated the patient about 5 minutes post-injection and she had improvement in pain and range of motion - follow-up with Dr. Roda Shutters as indicated; I am happy to see her as needed  Madelyn Brunner, DO Primary Care Sports Medicine Physician  [facility] - Orthoped[facility]  This note was dictated using Dragon naturally speaking software and may contain errors in syntax, spelling, or content which have not been identified prior to signing this note.

## 2022-11-18 ENCOUNTER — Ambulatory Visit: Payer: Medicare (Managed Care)

## 2022-11-21 ENCOUNTER — Encounter: Payer: Self-pay | Admitting: Emergency Medicine

## 2022-11-21 ENCOUNTER — Ambulatory Visit: Payer: Medicare (Managed Care) | Admitting: Emergency Medicine

## 2022-11-22 ENCOUNTER — Other Ambulatory Visit: Payer: Self-pay | Admitting: Emergency Medicine

## 2023-02-13 ENCOUNTER — Ambulatory Visit (INDEPENDENT_AMBULATORY_CARE_PROVIDER_SITE_OTHER): Payer: Medicare (Managed Care) | Admitting: Adult Health

## 2023-02-13 ENCOUNTER — Encounter: Payer: Self-pay | Admitting: Adult Health

## 2023-02-13 VITALS — BP 127/88 | HR 56 | Temp 98.4°F | Resp 18 | Ht <= 58 in | Wt 131.8 lb

## 2023-02-13 DIAGNOSIS — E785 Hyperlipidemia, unspecified: Secondary | ICD-10-CM

## 2023-02-13 DIAGNOSIS — F339 Major depressive disorder, recurrent, unspecified: Secondary | ICD-10-CM

## 2023-02-13 DIAGNOSIS — Z1231 Encounter for screening mammogram for malignant neoplasm of breast: Secondary | ICD-10-CM | POA: Diagnosis not present

## 2023-02-13 DIAGNOSIS — Z1329 Encounter for screening for other suspected endocrine disorder: Secondary | ICD-10-CM | POA: Diagnosis not present

## 2023-02-13 DIAGNOSIS — L720 Epidermal cyst: Secondary | ICD-10-CM

## 2023-02-13 DIAGNOSIS — Z113 Encounter for screening for infections with a predominantly sexual mode of transmission: Secondary | ICD-10-CM | POA: Diagnosis not present

## 2023-02-13 DIAGNOSIS — Z13820 Encounter for screening for osteoporosis: Secondary | ICD-10-CM | POA: Diagnosis not present

## 2023-02-13 DIAGNOSIS — R748 Abnormal levels of other serum enzymes: Secondary | ICD-10-CM | POA: Diagnosis not present

## 2023-02-13 DIAGNOSIS — G8929 Other chronic pain: Secondary | ICD-10-CM

## 2023-02-13 DIAGNOSIS — Z1211 Encounter for screening for malignant neoplasm of colon: Secondary | ICD-10-CM | POA: Diagnosis not present

## 2023-02-13 DIAGNOSIS — Z7689 Persons encountering health services in other specified circumstances: Secondary | ICD-10-CM | POA: Diagnosis not present

## 2023-02-13 DIAGNOSIS — Z131 Encounter for screening for diabetes mellitus: Secondary | ICD-10-CM

## 2023-02-13 DIAGNOSIS — M1612 Unilateral primary osteoarthritis, left hip: Secondary | ICD-10-CM | POA: Diagnosis not present

## 2023-02-13 DIAGNOSIS — Z23 Encounter for immunization: Secondary | ICD-10-CM | POA: Diagnosis not present

## 2023-02-13 MED ORDER — SIMVASTATIN 40 MG PO TABS: 40.0000 mg | ORAL_TABLET | Freq: Every evening | ORAL | 1 refills | Status: DC

## 2023-02-13 MED ORDER — CITALOPRAM HYDROBROMIDE 40 MG PO TABS: 40.0000 mg | ORAL_TABLET | Freq: Every day | ORAL | 1 refills | Status: DC
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
## 2023-02-13 NOTE — Patient Instructions (Signed)
Preventive Care 65 Years and Older, Female Preventive care refers to lifestyle choices and visits with your health care provider that can promote health and wellness. Preventive care visits are also called wellness exams. What can I expect for my preventive care visit? Counseling Your health care provider may ask you questions about your: Medical history, including: Past medical problems. Family medical history. Pregnancy and menstrual history. History of falls. Current health, including: Memory and ability to understand (cognition). Emotional well-being. Home life and relationship well-being. Sexual activity and sexual health. Lifestyle, including: Alcohol, nicotine or tobacco, and drug use. Access to firearms. Diet, exercise, and sleep habits. Work and work environment. Sunscreen use. Safety issues such as seatbelt and bike helmet use. Physical exam Your health care provider will check your: Height and weight. These may be used to calculate your BMI (body mass index). BMI is a measurement that tells if you are at a healthy weight. Waist circumference. This measures the distance around your waistline. This measurement also tells if you are at a healthy weight and may help predict your risk of certain diseases, such as type 2 diabetes and high blood pressure. Heart rate and blood pressure. Body temperature. Skin for abnormal spots. What immunizations do I need?  Vaccines are usually given at various ages, according to a schedule. Your health care provider will recommend vaccines for you based on your age, medical history, and lifestyle or other factors, such as travel or where you work. What tests do I need? Screening Your health care provider may recommend screening tests for certain conditions. This may include: Lipid and cholesterol levels. Hepatitis C test. Hepatitis B test. HIV (human immunodeficiency virus) test. STI (sexually transmitted infection) testing, if you are at  risk. Lung cancer screening. Colorectal cancer screening. Diabetes screening. This is done by checking your blood sugar (glucose) after you have not eaten for a while (fasting). Mammogram. Talk with your health care provider about how often you should have regular mammograms. BRCA-related cancer screening. This may be done if you have a family history of breast, ovarian, tubal, or peritoneal cancers. Bone density scan. This is done to screen for osteoporosis. Talk with your health care provider about your test results, treatment options, and if necessary, the need for more tests. Follow these instructions at home: Eating and drinking  Eat a diet that includes fresh fruits and vegetables, whole grains, lean protein, and low-fat dairy products. Limit your intake of foods with high amounts of sugar, saturated fats, and salt. Take vitamin and mineral supplements as recommended by your health care provider. Do not drink alcohol if your health care provider tells you not to drink. If you drink alcohol: Limit how much you have to 0-1 drink a day. Know how much alcohol is in your drink. In the [place] one drink equals one 12 oz bottle of beer (355 mL), one 5 oz glass of wine (148 mL), or one 1 oz glass of hard liquor (44 mL). Lifestyle Brush your teeth every morning and night with fluoride toothpaste. Floss one time each day. Exercise for at least 30 minutes 5 or more days each week. Do not use any products that contain nicotine or tobacco. These products include cigarettes, chewing tobacco, and vaping devices, such as e-cigarettes. If you need help quitting, ask your health care provider. Do not use drugs. If you are sexually active, practice safe sex. Use a condom or other form of protection in order to prevent STIs. Take aspirin only as told by   your health care provider. Make sure that you understand how much to take and what form to take. Work with your health care provider to find out whether it  is safe and beneficial for you to take aspirin daily. Ask your health care provider if you need to take a cholesterol-lowering medicine (statin). Find healthy ways to manage stress, such as: Meditation, yoga, or listening to music. Journaling. Talking to a trusted person. Spending time with friends and family. Minimize exposure to UV radiation to reduce your risk of skin cancer. Safety Always wear your seat belt while driving or riding in a vehicle. Do not drive: If you have been drinking alcohol. Do not ride with someone who has been drinking. When you are tired or distracted. While texting. If you have been using any mind-altering substances or drugs. Wear a helmet and other protective equipment during sports activities. If you have firearms in your house, make sure you follow all gun safety procedures. What's next? Visit your health care provider once a year for an annual wellness visit. Ask your health care provider how often you should have your eyes and teeth checked. Stay up to date on all vaccines. This information is not intended to replace advice given to you by your health care provider. Make sure you discuss any questions you have with your health care provider. Document Revised: 08/23/2020 Document Reviewed: 08/23/2020 Elsevier Patient Education  2024 Elsevier Inc.  

## 2023-02-14 LAB — CBC WITH DIFFERENTIAL/PLATELET
Absolute Lymphocytes: 1617 {cells}/uL (ref 850–3900)
Absolute Monocytes: 352 {cells}/uL (ref 200–950)
Basophils Absolute: 50 {cells}/uL (ref 0–200)
Basophils Relative: 0.9 %
Eosinophils Absolute: 121 {cells}/uL (ref 15–500)
Eosinophils Relative: 2.2 %
HCT: 40.4 % (ref 35.0–45.0)
Hemoglobin: 12.8 g/dL (ref 11.7–15.5)
MCH: 29 pg (ref 27.0–33.0)
MCHC: 31.7 g/dL — ABNORMAL LOW (ref 32.0–36.0)
MCV: 91.4 fL (ref 80.0–100.0)
MPV: 10.3 fL (ref 7.5–12.5)
Monocytes Relative: 6.4 %
Neutro Abs: 3361 {cells}/uL (ref 1500–7800)
Neutrophils Relative %: 61.1 %
Platelets: 284 10*3/uL (ref 140–400)
RBC: 4.42 10*6/uL (ref 3.80–5.10)
RDW: 12.9 % (ref 11.0–15.0)
Total Lymphocyte: 29.4 %
WBC: 5.5 10*3/uL (ref 3.8–10.8)

## 2023-02-14 LAB — COMPLETE METABOLIC PANEL WITH GFR
AG Ratio: 1.4 (calc) (ref 1.0–2.5)
ALT: 8 U/L (ref 6–29)
AST: 19 U/L (ref 10–35)
Albumin: 4.2 g/dL (ref 3.6–5.1)
Alkaline phosphatase (APISO): 37 U/L (ref 37–153)
BUN: 18 mg/dL (ref 7–25)
CO2: 28 mmol/L (ref 20–32)
Calcium: 9.6 mg/dL (ref 8.6–10.4)
Chloride: 106 mmol/L (ref 98–110)
Creat: 0.71 mg/dL (ref 0.60–1.00)
Globulin: 2.9 g/dL (ref 1.9–3.7)
Glucose, Bld: 81 mg/dL (ref 65–99)
Potassium: 4.6 mmol/L (ref 3.5–5.3)
Sodium: 140 mmol/L (ref 135–146)
Total Bilirubin: 0.8 mg/dL (ref 0.2–1.2)
Total Protein: 7.1 g/dL (ref 6.1–8.1)
eGFR: 90 mL/min/{1.73_m2} (ref >=60)

## 2023-02-14 LAB — TSH: TSH: 1.52 m[IU]/L (ref 0.40–4.50)

## 2023-02-14 LAB — HIV ANTIBODY (ROUTINE TESTING W REFLEX): HIV 1&2 Ab, 4th Generation: NONREACTIVE

## 2023-02-14 LAB — HEMOGLOBIN A1C
Hgb A1c MFr Bld: 5.4 %{Hb} (ref <5.7)
Mean Plasma Glucose: 108 mg/dL
eAG (mmol/L): 6 mmol/L

## 2023-02-14 LAB — HEPATITIS C ANTIBODY: Hepatitis C Ab: NONREACTIVE

## 2023-02-17 NOTE — Progress Notes (Signed)
-    no anemia -  electrolytes, liver enzymes normal -  not diabetic -  lipid panel normal, tsh normal -  hep C and HIV antibody normal -  excellent labs

## 2023-02-27 ENCOUNTER — Ambulatory Visit: Payer: Medicare (Managed Care) | Admitting: Adult Health

## 2023-02-28 ENCOUNTER — Ambulatory Visit: Payer: Medicare (Managed Care)

## 2023-03-25 ENCOUNTER — Ambulatory Visit: Payer: Medicare (Managed Care)

## 2023-03-27 ENCOUNTER — Ambulatory Visit
Admission: RE | Admit: 2023-03-27 | Discharge: 2023-03-27 | Disposition: A | Discharge status: Home / Self Care | Payer: Medicare (Managed Care) | Source: Ambulatory Visit | Attending: Adult Health | Admitting: Adult Health

## 2023-05-14 ENCOUNTER — Encounter: Payer: Self-pay | Admitting: Adult Health

## 2023-05-21 ENCOUNTER — Telehealth: Payer: Self-pay

## 2023-05-21 DIAGNOSIS — E785 Hyperlipidemia, unspecified: Secondary | ICD-10-CM

## 2023-05-21 DIAGNOSIS — F339 Major depressive disorder, recurrent, unspecified: Secondary | ICD-10-CM

## 2023-05-21 MED ORDER — SIMVASTATIN 40 MG PO TABS: 40.0000 mg | ORAL_TABLET | Freq: Every evening | ORAL | 1 refills | Status: DC

## 2023-05-21 MED ORDER — CITALOPRAM HYDROBROMIDE 40 MG PO TABS: 40.0000 mg | ORAL_TABLET | Freq: Every day | ORAL | 1 refills | Status: DC

## 2023-05-21 NOTE — Telephone Encounter (Signed)
 I have approved all eRx except Bromfenac eye drops, will need to be refilled by her eye doctor.

## 2023-05-21 NOTE — Telephone Encounter (Signed)
 Incoming fax received requesting refills on citalopram, simvastatin, and bromfenac eye drops. I called patient to verify request as [facility] is no on her current medication list. Patient states she changed insurances and will now be suing [facility]     The bromfenac rx request will be pended for the provider to review and approve if warranted

## 2023-06-03 ENCOUNTER — Ambulatory Visit (HOSPITAL_COMMUNITY): Payer: Self-pay | Admitting: Psychiatry

## 2023-06-14 IMAGING — MG MM DIGITAL SCREENING BILAT W/ TOMO AND CAD
8 series · 8 of 24 positions shown · non-contrast
Comparison: Previous exam(s).

CLINICAL DATA: Screening.

EXAM:
DIGITAL SCREENING BILATERAL MAMMOGRAM WITH TOMOSYNTHESIS AND CAD
TECHNIQUE: Bilateral screening digital craniocaudal and mediolateral oblique
mammograms were obtained. Bilateral screening digital breast
tomosynthesis was performed. The images were evaluated with
computer-aided detection.

[R MLO synth-2D]
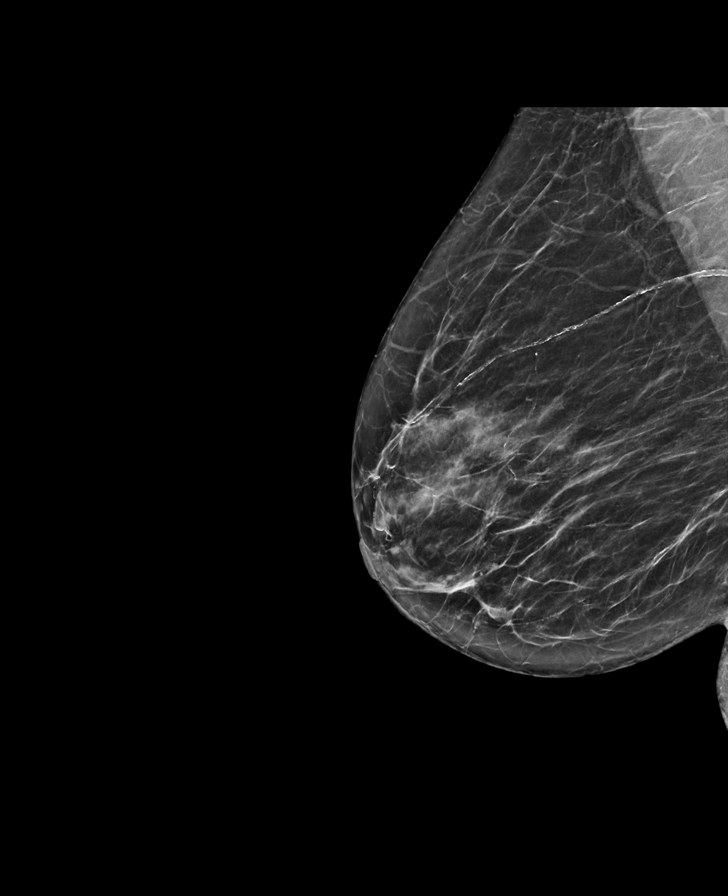

[L MLO synth-2D]
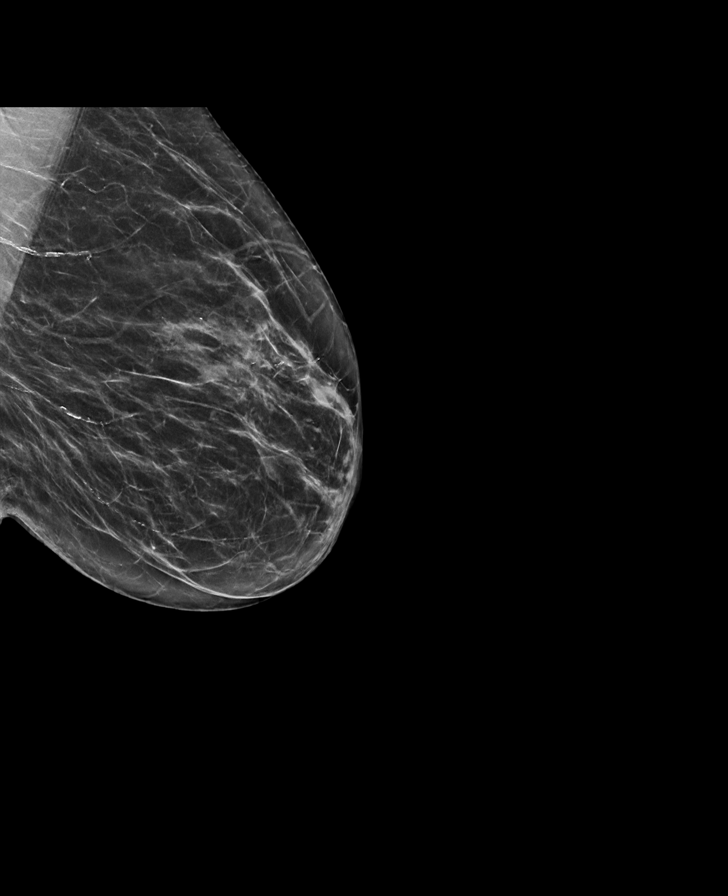

[L CC synth-2D]
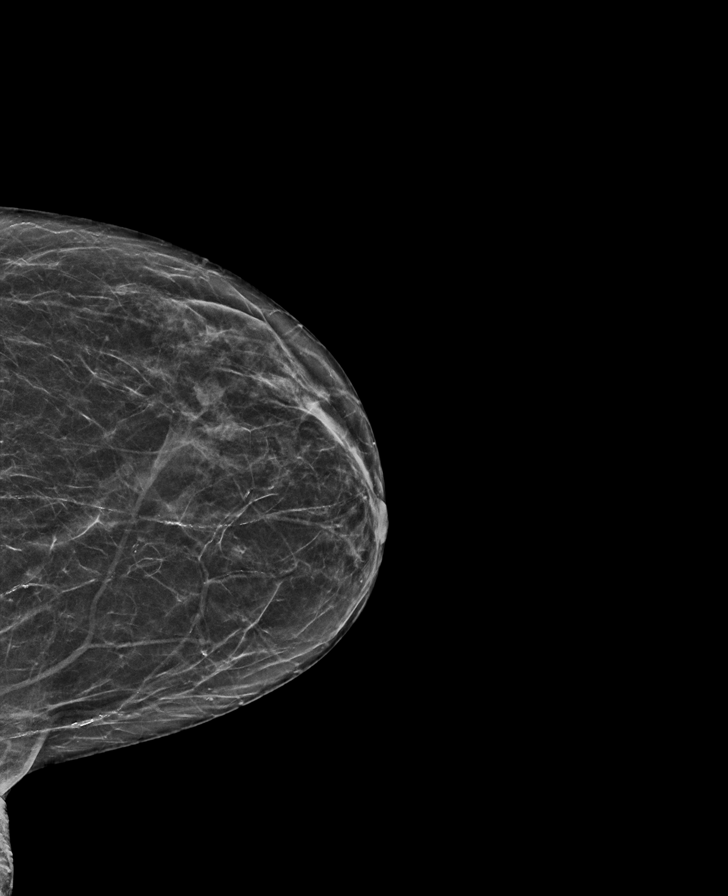

[R CC synth-2D]
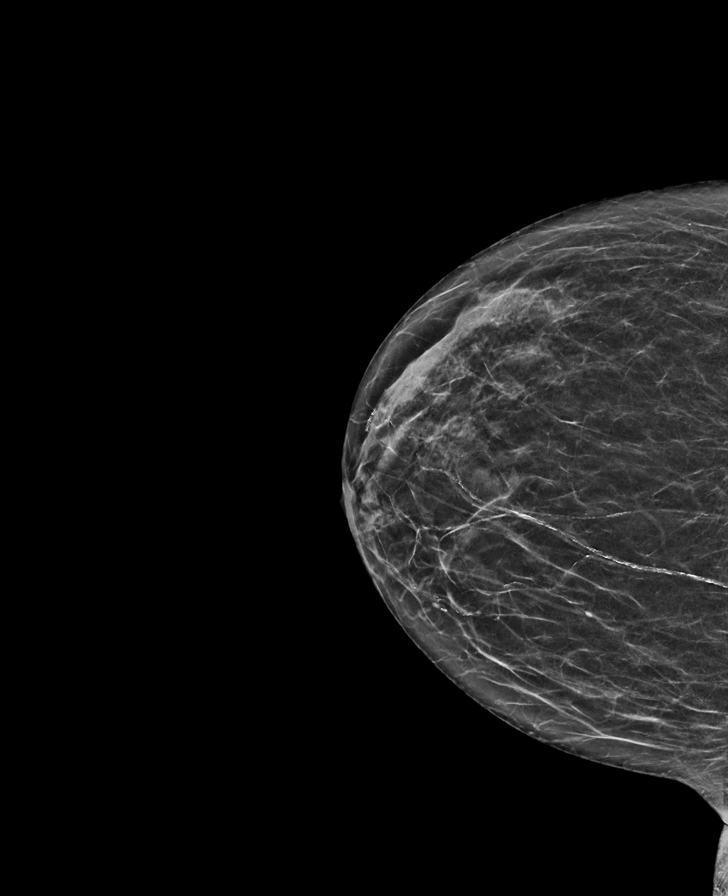

[R CC tomo · tomo slice 28/55.0]
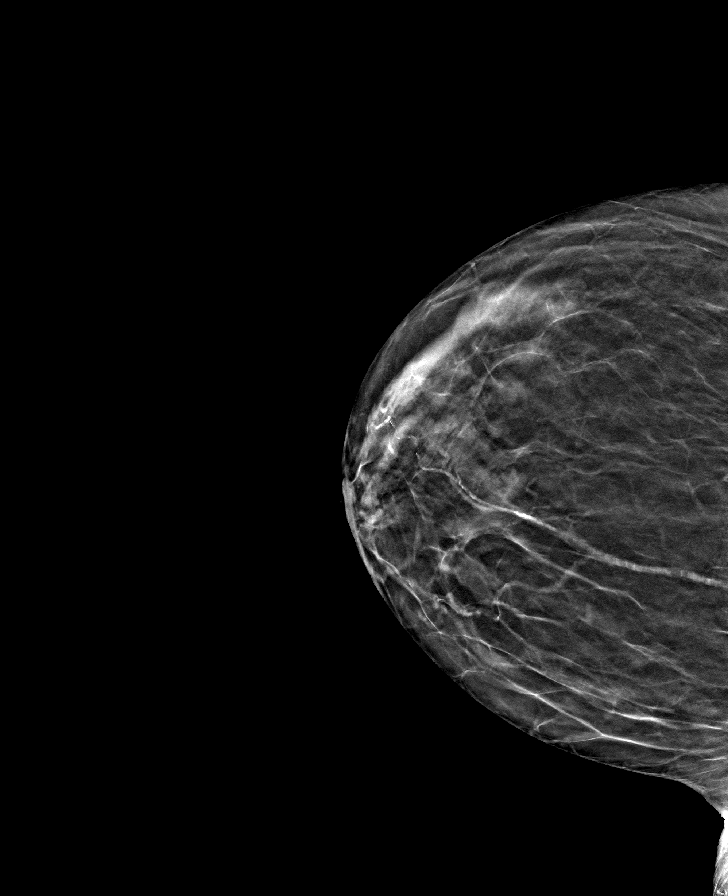

[R MLO tomo · tomo slice 33/65.0]
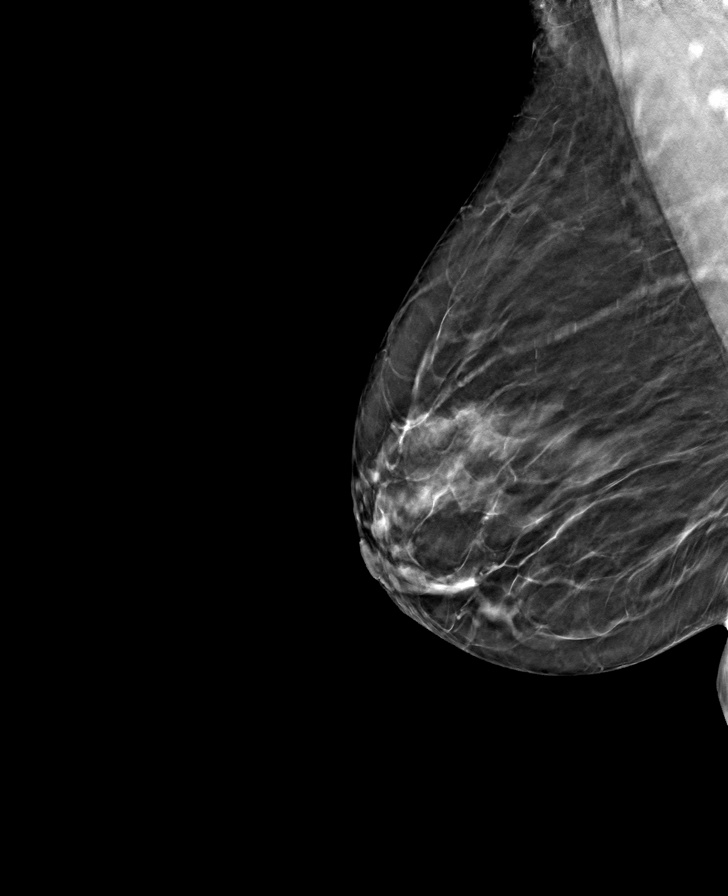

[L CC tomo · tomo slice 29/57.0]
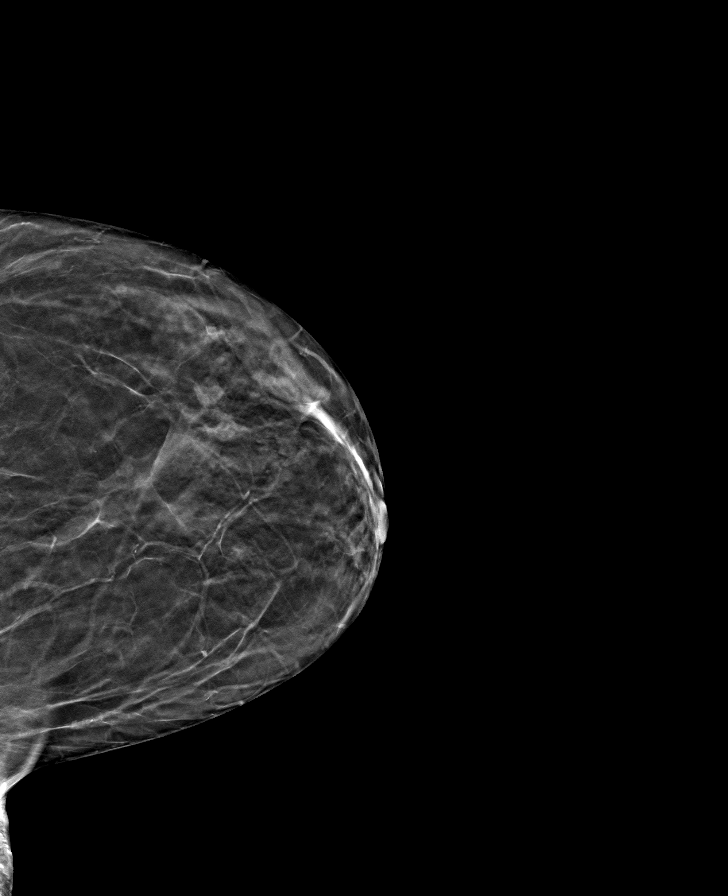

[L MLO tomo · tomo slice 33/65.0]
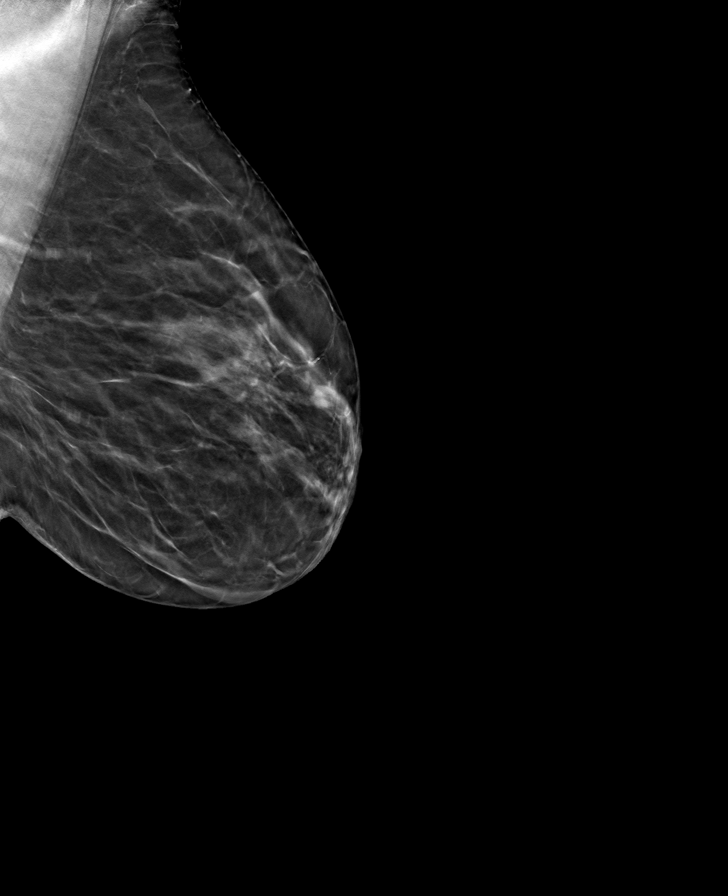

[8 of 24 positions shown; findings below may reference images not displayed]

ACR Breast Density Category b: There are scattered areas of
fibroglandular density.
FINDINGS: There are no findings suspicious for malignancy.
IMPRESSION: No mammographic evidence of malignancy. A result letter of this
screening mammogram will be mailed directly to the patient.

RECOMMENDATION:
Screening mammogram in one year. (Code:51-O-LD2)

BI-RADS CATEGORY  1: Negative.

## 2023-07-02 DIAGNOSIS — I10 Essential (primary) hypertension: Secondary | ICD-10-CM | POA: Diagnosis not present

## 2023-07-02 DIAGNOSIS — M5441 Lumbago with sciatica, right side: Secondary | ICD-10-CM | POA: Diagnosis not present

## 2023-07-02 DIAGNOSIS — E559 Vitamin D deficiency, unspecified: Secondary | ICD-10-CM | POA: Diagnosis not present

## 2023-07-02 DIAGNOSIS — M5442 Lumbago with sciatica, left side: Secondary | ICD-10-CM | POA: Diagnosis not present

## 2023-07-02 DIAGNOSIS — Z1339 Encounter for screening examination for other mental health and behavioral disorders: Secondary | ICD-10-CM | POA: Diagnosis not present

## 2023-07-02 DIAGNOSIS — F329 Major depressive disorder, single episode, unspecified: Secondary | ICD-10-CM | POA: Diagnosis not present

## 2023-07-02 DIAGNOSIS — M47812 Spondylosis without myelopathy or radiculopathy, cervical region: Secondary | ICD-10-CM | POA: Diagnosis not present

## 2023-07-02 DIAGNOSIS — G8929 Other chronic pain: Secondary | ICD-10-CM | POA: Diagnosis not present

## 2023-07-02 DIAGNOSIS — M25552 Pain in left hip: Secondary | ICD-10-CM | POA: Diagnosis not present

## 2023-08-18 ENCOUNTER — Ambulatory Visit: Admitting: Dermatology

## 2023-10-13 ENCOUNTER — Other Ambulatory Visit: Payer: Self-pay | Admitting: Adult Health

## 2023-10-13 DIAGNOSIS — F339 Major depressive disorder, recurrent, unspecified: Secondary | ICD-10-CM

## 2023-10-15 ENCOUNTER — Other Ambulatory Visit: Payer: Self-pay | Admitting: Adult Health

## 2023-10-15 ENCOUNTER — Other Ambulatory Visit: Payer: Medicare (Managed Care)

## 2023-10-15 DIAGNOSIS — E785 Hyperlipidemia, unspecified: Secondary | ICD-10-CM

## 2023-10-15 NOTE — Telephone Encounter (Signed)
 Pharmacy requested refill.  Patient needs an appointment before anymore future refills.  MyChart Message sent to patient.

## 2023-11-17 ENCOUNTER — Other Ambulatory Visit: Payer: Self-pay | Admitting: Adult Health

## 2023-11-17 DIAGNOSIS — E785 Hyperlipidemia, unspecified: Secondary | ICD-10-CM

## 2023-12-09 ENCOUNTER — Other Ambulatory Visit: Payer: Self-pay | Admitting: Adult Health

## 2023-12-09 DIAGNOSIS — E785 Hyperlipidemia, unspecified: Secondary | ICD-10-CM

## 2023-12-09 NOTE — Telephone Encounter (Signed)
 Pharmacy requested refill.  Message sent to patient that she is OVERDUE for an appointment.   Pended Rx and sent to [facility] for Approval/Denial

## 2023-12-12 NOTE — Telephone Encounter (Signed)
 error

## 2023-12-29 ENCOUNTER — Other Ambulatory Visit: Payer: Self-pay | Admitting: Adult Health

## 2023-12-29 DIAGNOSIS — E785 Hyperlipidemia, unspecified: Secondary | ICD-10-CM
